# Patient Record
Sex: Female | Born: 1965 | Race: White | Hispanic: No | Smoking: Never smoker
Health system: Southern US, Community
[De-identification: ages and names within clinical notes are randomized; demographics above are authoritative.]

## PROBLEM LIST (undated history)

## (undated) DIAGNOSIS — K8020 Calculus of gallbladder without cholecystitis without obstruction: Secondary | ICD-10-CM

## (undated) DIAGNOSIS — Z8489 Family history of other specified conditions: Secondary | ICD-10-CM

## (undated) DIAGNOSIS — N809 Endometriosis, unspecified: Secondary | ICD-10-CM

## (undated) HISTORY — PX: APPENDECTOMY: SHX54

## (undated) HISTORY — PX: PARTIAL HYSTERECTOMY: SHX80

## (undated) HISTORY — DX: Endometriosis, unspecified: N80.9

## (undated) HISTORY — PX: UMBILICAL HERNIA REPAIR: SHX196

## (undated) HISTORY — PX: RIGHT OOPHORECTOMY: SHX2359

---

## 1998-01-16 ENCOUNTER — Other Ambulatory Visit: Admission: RE | Admit: 1998-01-16 | Discharge: 1998-01-16 | Payer: Self-pay | Admitting: Obstetrics and Gynecology

## 1999-03-13 ENCOUNTER — Other Ambulatory Visit: Admission: RE | Admit: 1999-03-13 | Discharge: 1999-03-13 | Payer: Self-pay | Admitting: Obstetrics and Gynecology

## 2000-03-28 ENCOUNTER — Other Ambulatory Visit: Admission: RE | Admit: 2000-03-28 | Discharge: 2000-03-28 | Payer: Self-pay | Admitting: Obstetrics and Gynecology

## 2000-09-24 ENCOUNTER — Ambulatory Visit (HOSPITAL_COMMUNITY): Admission: RE | Admit: 2000-09-24 | Discharge: 2000-09-24 | Payer: Self-pay | Admitting: Obstetrics and Gynecology

## 2000-09-24 ENCOUNTER — Encounter: Payer: Self-pay | Admitting: Obstetrics and Gynecology

## 2001-01-22 ENCOUNTER — Inpatient Hospital Stay (HOSPITAL_COMMUNITY): Admission: AD | Admit: 2001-01-22 | Discharge: 2001-01-22 | Payer: Self-pay | Admitting: Obstetrics and Gynecology

## 2001-03-18 ENCOUNTER — Inpatient Hospital Stay (HOSPITAL_COMMUNITY): Admission: AD | Admit: 2001-03-18 | Discharge: 2001-03-18 | Payer: Self-pay | Admitting: Obstetrics and Gynecology

## 2001-05-08 ENCOUNTER — Other Ambulatory Visit: Admission: RE | Admit: 2001-05-08 | Discharge: 2001-05-08 | Payer: Self-pay | Admitting: Obstetrics and Gynecology

## 2001-06-10 ENCOUNTER — Inpatient Hospital Stay (HOSPITAL_COMMUNITY): Admission: AD | Admit: 2001-06-10 | Discharge: 2001-06-10 | Payer: Self-pay | Admitting: Obstetrics and Gynecology

## 2001-07-07 ENCOUNTER — Inpatient Hospital Stay (HOSPITAL_COMMUNITY): Admission: AD | Admit: 2001-07-07 | Discharge: 2001-07-07 | Payer: Self-pay | Admitting: Obstetrics & Gynecology

## 2001-08-20 ENCOUNTER — Ambulatory Visit (HOSPITAL_COMMUNITY): Admission: RE | Admit: 2001-08-20 | Discharge: 2001-08-20 | Payer: Self-pay | Admitting: Obstetrics and Gynecology

## 2002-01-12 ENCOUNTER — Inpatient Hospital Stay (HOSPITAL_COMMUNITY): Admission: RE | Admit: 2002-01-12 | Discharge: 2002-01-14 | Payer: Self-pay | Admitting: Obstetrics and Gynecology

## 2002-04-09 ENCOUNTER — Ambulatory Visit (HOSPITAL_COMMUNITY): Admission: RE | Admit: 2002-04-09 | Discharge: 2002-04-09 | Payer: Self-pay | Admitting: Obstetrics and Gynecology

## 2002-04-09 ENCOUNTER — Encounter: Payer: Self-pay | Admitting: Obstetrics and Gynecology

## 2002-05-24 ENCOUNTER — Other Ambulatory Visit: Admission: RE | Admit: 2002-05-24 | Discharge: 2002-05-24 | Payer: Self-pay | Admitting: Obstetrics and Gynecology

## 2002-08-03 ENCOUNTER — Inpatient Hospital Stay (HOSPITAL_COMMUNITY): Admission: AD | Admit: 2002-08-03 | Discharge: 2002-08-03 | Payer: Self-pay | Admitting: Obstetrics and Gynecology

## 2002-08-27 ENCOUNTER — Inpatient Hospital Stay (HOSPITAL_COMMUNITY): Admission: AD | Admit: 2002-08-27 | Discharge: 2002-08-27 | Payer: Self-pay | Admitting: Obstetrics and Gynecology

## 2002-08-28 ENCOUNTER — Inpatient Hospital Stay (HOSPITAL_COMMUNITY): Admission: RE | Admit: 2002-08-28 | Discharge: 2002-08-28 | Payer: Self-pay | Admitting: Obstetrics and Gynecology

## 2002-09-24 ENCOUNTER — Inpatient Hospital Stay (HOSPITAL_COMMUNITY): Admission: AD | Admit: 2002-09-24 | Discharge: 2002-09-24 | Payer: Self-pay | Admitting: Obstetrics & Gynecology

## 2002-09-25 ENCOUNTER — Inpatient Hospital Stay (HOSPITAL_COMMUNITY): Admission: RE | Admit: 2002-09-25 | Discharge: 2002-09-25 | Payer: Self-pay | Admitting: Obstetrics & Gynecology

## 2002-10-21 ENCOUNTER — Encounter: Payer: Self-pay | Admitting: Obstetrics and Gynecology

## 2002-10-21 ENCOUNTER — Inpatient Hospital Stay (HOSPITAL_COMMUNITY): Admission: RE | Admit: 2002-10-21 | Discharge: 2002-10-21 | Payer: Self-pay | Admitting: Obstetrics and Gynecology

## 2003-05-27 ENCOUNTER — Other Ambulatory Visit: Admission: RE | Admit: 2003-05-27 | Discharge: 2003-05-27 | Payer: Self-pay | Admitting: Obstetrics and Gynecology

## 2004-06-18 ENCOUNTER — Other Ambulatory Visit: Admission: RE | Admit: 2004-06-18 | Discharge: 2004-06-18 | Payer: Self-pay | Admitting: Obstetrics and Gynecology

## 2005-08-22 ENCOUNTER — Other Ambulatory Visit: Admission: RE | Admit: 2005-08-22 | Discharge: 2005-08-22 | Payer: Self-pay | Admitting: Obstetrics and Gynecology

## 2018-04-30 ENCOUNTER — Inpatient Hospital Stay (HOSPITAL_COMMUNITY)
Admission: EM | Admit: 2018-04-30 | Discharge: 2018-05-03 | DRG: 419 | Disposition: A | Discharge status: Home / Self Care | Payer: BC Managed Care – PPO | Source: Other Acute Inpatient Hospital | Attending: Internal Medicine | Admitting: Internal Medicine

## 2018-04-30 ENCOUNTER — Encounter (HOSPITAL_COMMUNITY): Payer: Self-pay | Admitting: Internal Medicine

## 2018-04-30 ENCOUNTER — Other Ambulatory Visit: Payer: Self-pay

## 2018-04-30 ENCOUNTER — Inpatient Hospital Stay (HOSPITAL_COMMUNITY): Payer: BC Managed Care – PPO

## 2018-04-30 DIAGNOSIS — K8050 Calculus of bile duct without cholangitis or cholecystitis without obstruction: Secondary | ICD-10-CM | POA: Diagnosis not present

## 2018-04-30 DIAGNOSIS — R945 Abnormal results of liver function studies: Secondary | ICD-10-CM

## 2018-04-30 DIAGNOSIS — R7989 Other specified abnormal findings of blood chemistry: Secondary | ICD-10-CM

## 2018-04-30 DIAGNOSIS — K8051 Calculus of bile duct without cholangitis or cholecystitis with obstruction: Secondary | ICD-10-CM | POA: Diagnosis not present

## 2018-04-30 DIAGNOSIS — M549 Dorsalgia, unspecified: Secondary | ICD-10-CM | POA: Diagnosis present

## 2018-04-30 DIAGNOSIS — R030 Elevated blood-pressure reading, without diagnosis of hypertension: Secondary | ICD-10-CM | POA: Diagnosis present

## 2018-04-30 DIAGNOSIS — K8020 Calculus of gallbladder without cholecystitis without obstruction: Secondary | ICD-10-CM

## 2018-04-30 DIAGNOSIS — Z6838 Body mass index (BMI) 38.0-38.9, adult: Secondary | ICD-10-CM | POA: Diagnosis not present

## 2018-04-30 DIAGNOSIS — K8062 Calculus of gallbladder and bile duct with acute cholecystitis without obstruction: Principal | ICD-10-CM | POA: Diagnosis present

## 2018-04-30 DIAGNOSIS — E669 Obesity, unspecified: Secondary | ICD-10-CM | POA: Diagnosis present

## 2018-04-30 DIAGNOSIS — K8042 Calculus of bile duct with acute cholecystitis without obstruction: Secondary | ICD-10-CM | POA: Diagnosis not present

## 2018-04-30 HISTORY — DX: Calculus of gallbladder without cholecystitis without obstruction: K80.20

## 2018-04-30 HISTORY — DX: Family history of other specified conditions: Z84.89

## 2018-04-30 LAB — COMPREHENSIVE METABOLIC PANEL
ALT: 132 U/L — ABNORMAL HIGH (ref 0–44)
AST: 125 U/L — ABNORMAL HIGH (ref 15–41)
Albumin: 3.3 g/dL — ABNORMAL LOW (ref 3.5–5.0)
Alkaline Phosphatase: 145 U/L — ABNORMAL HIGH (ref 38–126)
Anion gap: 9 (ref 5–15)
BUN: 13 mg/dL (ref 6–20)
CO2: 28 mmol/L (ref 22–32)
Calcium: 9.2 mg/dL (ref 8.9–10.3)
Chloride: 105 mmol/L (ref 98–111)
Creatinine, Ser: 0.88 mg/dL (ref 0.44–1.00)
GFR calc Af Amer: >60 mL/min (ref >60)
GFR calc non Af Amer: >60 mL/min (ref >60)
Glucose, Bld: 116 mg/dL — ABNORMAL HIGH (ref 70–99)
Potassium: 3.6 mmol/L (ref 3.5–5.1)
Sodium: 142 mmol/L (ref 135–145)
Total Bilirubin: 3.6 mg/dL — ABNORMAL HIGH (ref 0.3–1.2)
Total Protein: 6.9 g/dL (ref 6.5–8.1)

## 2018-04-30 LAB — CBC WITH DIFFERENTIAL/PLATELET
Abs Immature Granulocytes: 0 10*3/uL (ref 0.0–0.1)
Basophils Absolute: 0 10*3/uL (ref 0.0–0.1)
Basophils Relative: 0 %
Eosinophils Absolute: 0.1 10*3/uL (ref 0.0–0.7)
Eosinophils Relative: 1 %
HCT: 45.2 % (ref 36.0–46.0)
Hemoglobin: 14.8 g/dL (ref 12.0–15.0)
Immature Granulocytes: 0 %
Lymphocytes Relative: 6 %
Lymphs Abs: 0.6 10*3/uL — ABNORMAL LOW (ref 0.7–4.0)
MCH: 29.7 pg (ref 26.0–34.0)
MCHC: 32.7 g/dL (ref 30.0–36.0)
MCV: 90.6 fL (ref 78.0–100.0)
Monocytes Absolute: 0.6 10*3/uL (ref 0.1–1.0)
Monocytes Relative: 7 %
Neutro Abs: 7.5 10*3/uL (ref 1.7–7.7)
Neutrophils Relative %: 86 %
Platelets: 185 10*3/uL (ref 150–400)
RBC: 4.99 MIL/uL (ref 3.87–5.11)
RDW: 13.1 % (ref 11.5–15.5)
WBC: 8.8 10*3/uL (ref 4.0–10.5)

## 2018-04-30 LAB — PROTIME-INR
INR: 1.1
Prothrombin Time: 14.1 seconds (ref 11.4–15.2)

## 2018-04-30 MED ORDER — MORPHINE SULFATE (PF) 2 MG/ML IV SOLN
2.0000 mg | INTRAVENOUS | Status: DC | PRN
  Administered 2018-04-30 – 2018-05-01 (×5): 2 mg via INTRAVENOUS
  Filled 2018-04-30 (×5): qty 1

## 2018-04-30 MED ORDER — ONDANSETRON HCL 4 MG/2ML IJ SOLN
4.0000 mg | Freq: Four times a day (QID) | INTRAMUSCULAR | Status: DC | PRN
Start: 2018-04-30 — End: 2018-05-03
  Administered 2018-04-30 – 2018-05-01 (×2): 4 mg via INTRAVENOUS
  Filled 2018-04-30 (×2): qty 2

## 2018-04-30 MED ORDER — GADOBENATE DIMEGLUMINE 529 MG/ML IV SOLN
18.0000 mL | Freq: Once | INTRAVENOUS | Status: AC
  Administered 2018-04-30: 18 mL via INTRAVENOUS

## 2018-04-30 MED ORDER — ACETAMINOPHEN 325 MG PO TABS
650.0000 mg | ORAL_TABLET | Freq: Four times a day (QID) | ORAL | Status: DC | PRN
  Administered 2018-05-03: 650 mg via ORAL
  Filled 2018-04-30: qty 2

## 2018-04-30 MED ORDER — PIPERACILLIN-TAZOBACTAM 3.375 G IVPB
3.3750 g | Freq: Three times a day (TID) | INTRAVENOUS | Status: DC
  Administered 2018-04-30: 3.375 g via INTRAVENOUS
  Filled 2018-04-30 (×2): qty 50

## 2018-04-30 MED ORDER — LACTATED RINGERS IV SOLN
INTRAVENOUS | Status: DC
  Administered 2018-04-30 – 2018-05-02 (×2): via INTRAVENOUS

## 2018-04-30 MED ORDER — ONDANSETRON HCL 4 MG PO TABS
4.0000 mg | ORAL_TABLET | Freq: Four times a day (QID) | ORAL | Status: DC | PRN
  Administered 2018-05-02: 4 mg via ORAL
  Filled 2018-04-30: qty 1

## 2018-04-30 MED ORDER — PIPERACILLIN-TAZOBACTAM 3.375 G IVPB
3.3750 g | Freq: Three times a day (TID) | INTRAVENOUS | Status: DC
  Administered 2018-04-30 – 2018-05-03 (×7): 3.375 g via INTRAVENOUS
  Filled 2018-04-30 (×9): qty 50

## 2018-04-30 MED ORDER — ACETAMINOPHEN 650 MG RE SUPP: 650.0000 mg | Freq: Four times a day (QID) | RECTAL | Status: DC | PRN

## 2018-04-30 MED ORDER — LORAZEPAM 2 MG/ML IJ SOLN
1.0000 mg | Freq: Once | INTRAMUSCULAR | Status: DC
  Filled 2018-04-30: qty 1

## 2018-04-30 NOTE — Consult Note (Signed)
[facility] Surgery Consult Note  Cindy Hall September 01, 1966  [id number].    Requesting MD: Karmen Bongo Chief Complaint/Reason for Consult: gallstones  HPI:  Cindy Hall is a 52yo female transferred from [facility] to [facility] today with acute cholecystitis/possible choledocholithiasis. Patient states that she had acute onset chest pain and nausea on Monday. Evaluated in the ED with negative cardiac workup and she was sent home with phenergan. Initially felt somewhat better then the pain got worse again yesterday despite ibuprofen so she called 911. Pain is in her chest, upper abdomen, and radiating into her mid-back. Constant and severe. Worse with PO intake. Associated with chills and nausea. Prior to these 2 episodes she had never had pain like this before.  ED workup included a CT abdomen that reports cholelithiasis and choledocholithiasis with gallbladder changes consistent with acute cholecystitis, mild bile duct dilatation. WBC 13.6, bilirubin 1.8, AST 76.1, ALT 58, alk phos 143. Patient was transferred to [facility] for evaluation by GI due to possible choledocholithiasis. She was started on zosyn.  No significant PMH Abdominal surgical history: open appendectomy, partial hysterectomy, umbilical hernia repair Anticoagulants: none Nonsmoker Employment: Community education officer  ROS: Review of Systems  Constitutional: Positive for chills.  HENT: Negative.   Eyes: Negative.   Respiratory: Negative.   Cardiovascular: Positive for chest pain.  Gastrointestinal: Positive for abdominal pain and nausea.  Genitourinary: Negative.   Musculoskeletal: Positive for back pain.  Skin: Negative.   Neurological: Negative.    All systems reviewed and otherwise negative except for as above  Family History  Problem Relation Age of Onset  . Colon cancer Mother 21  . Gallbladder disease Neg Hx     Past Medical History:  Diagnosis Date  . Family history of adverse reaction to anesthesia     " my father had to be put on a ventilator after surgery "  . Gallstones 04/30/2018    Past Surgical History:  Procedure Laterality Date  . APPENDECTOMY     ruptured appy  . PARTIAL HYSTERECTOMY    . UMBILICAL HERNIA REPAIR      Social History:  reports that she has never smoked. She has never used smokeless tobacco. She reports that she does not drink alcohol or use drugs.  Allergies: No Known Allergies  No medications prior to admission.    Prior to Admission medications   Not on File    Blood pressure (!) 144/81, pulse 72, temperature 98.6 F (37 C), temperature source Oral, resp. rate 18. Physical Exam: General: pleasant, WD/WN white female who is laying in bed in NAD HEENT: head is normocephalic, atraumatic.  Sclera are noninjected.  Pupils equal and round.  Ears and nose without any masses or lesions.  Mouth is pink and moist. Dentition fair Heart: regular, rate, and rhythm.  No obvious murmurs, gallops, or rubs noted.  Palpable pedal pulses bilaterally Lungs: CTAB, no wheezes, rhonchi, or rales noted.  Respiratory effort nonlabored Abd: well healed RLQ and periumbilical incisions, soft, ND, +BS, TTP epigastric region with voluntary guarding MS: all 4 extremities are symmetrical with no cyanosis, clubbing, or edema. Skin: warm and dry with no masses, lesions, or rashes Psych: A&Ox3 with an appropriate affect. Neuro: cranial nerves grossly intact, extremity CSM intact bilaterally, normal speech  Results for orders placed or performed during the hospital encounter of 04/30/18 (from the past 48 hour(s))  CBC with Differential/Platelet     Status: Abnormal   Collection Time: 04/30/18 10:54 AM  Result Value Ref Range  WBC 8.8 4.0 - 10.5 K/uL   RBC 4.99 3.87 - 5.11 MIL/uL   Hemoglobin 14.8 12.0 - 15.0 g/dL   HCT 45.2 36.0 - 46.0 %   MCV 90.6 78.0 - 100.0 fL   MCH 29.7 26.0 - 34.0 pg   MCHC 32.7 30.0 - 36.0 g/dL   RDW 13.1 11.5 - 15.5 %   Platelets 185 150 - 400 K/uL    Neutrophils Relative % 86 %   Neutro Abs 7.5 1.7 - 7.7 K/uL   Lymphocytes Relative 6 %   Lymphs Abs 0.6 (L) 0.7 - 4.0 K/uL   Monocytes Relative 7 %   Monocytes Absolute 0.6 0.1 - 1.0 K/uL   Eosinophils Relative 1 %   Eosinophils Absolute 0.1 0.0 - 0.7 K/uL   Basophils Relative 0 %   Basophils Absolute 0.0 0.0 - 0.1 K/uL   Immature Granulocytes 0 %   Abs Immature Granulocytes 0.0 0.0 - 0.1 K/uL    Comment: Performed at [facility] [address], [place], [place] [address]  Comprehensive metabolic panel     Status: Abnormal   Collection Time: 04/30/18 10:54 AM  Result Value Ref Range   Sodium 142 135 - 145 mmol/L   Potassium 3.6 3.5 - 5.1 mmol/L   Chloride 105 98 - 111 mmol/L   CO2 28 22 - 32 mmol/L   Glucose, Bld 116 (H) 70 - 99 mg/dL   BUN 13 6 - 20 mg/dL   Creatinine, Ser 0.88 0.44 - 1.00 mg/dL   Calcium 9.2 8.9 - 10.3 mg/dL   Total Protein 6.9 6.5 - 8.1 g/dL   Albumin 3.3 (L) 3.5 - 5.0 g/dL   AST 125 (H) 15 - 41 U/L   ALT 132 (H) 0 - 44 U/L   Alkaline Phosphatase 145 (H) 38 - 126 U/L   Total Bilirubin 3.6 (H) 0.3 - 1.2 mg/dL   GFR calc non Af Amer >60 >60 mL/min   GFR calc Af Amer >60 >60 mL/min    Comment: (NOTE) The eGFR has been calculated using the CKD EPI equation. This calculation has not been validated in all clinical situations. eGFR's persistently <60 mL/min signify possible Chronic Kidney Disease.    Anion gap 9 5 - 15    Comment: Performed at [facility] [address] [place], [place] [address]  Protime-INR     Status: None   Collection Time: 04/30/18 10:54 AM  Result Value Ref Range   Prothrombin Time 14.1 11.4 - 15.2 seconds   INR 1.10     Comment: Performed at [facility] [address], [place], [place] [address]   No results found.  Anti-infectives (From admission, onward)   Start     Dose/Rate Route Frequency Ordered Stop   04/30/18 0945  piperacillin-tazobactam (ZOSYN) IVPB 3.375 g     3.375 g 12.5 mL/hr over 240  Minutes Intravenous Every 8 hours 04/30/18 0935         Assessment/Plan Acute cholecystitis Elevated bilirubin ?Choledocholithasis - CT abdomen reports cholelithiasis and choledocholithiasis with gallbladder changes consistent with acute cholecystitis, mild bile duct dilatation - WBC 13.6, bilirubin 1.8, AST 76.1, ALT 58, alk phos 143  ID - zosyn 8/15>> VTE - SCDs FEN - IVF, NPO Foley - none  Plan - Patient with acute cholecystitis and possible choledocholithiasis. GI evaluation pending, may need MRCP or ERCP prior to surgery. Will plan for laparoscopic cholecystectomy this admission. Continue IV zosyn. General surgery will continue  to follow.  Wellington Hampshire, [facility] Surgery 04/30/2018, 12:17 PM Pager: [phone] Consults: [phone] Mon 7:00 am -11:30 AM Tues-Fri 7:00 am-4:30 pm Sat-Sun 7:00 am-11:30 am

## 2018-04-30 NOTE — H&P (Signed)
History and Physical    Cindy Perchesenny B Sox ZOX:[id number] DOB: 12-25-1965 DOA: 04/30/2018  PCP:   Boneta LucksMoore, [facility] Consultants:  None Patient coming from:  Home - lives with 2 sons; NOKRockey Situ: Brother, [phone]   Chief Complaint:  Back pain  HPI: Cindy Hall is a 52 y.o. female with no significant past medical history [facility]ole[facility]/choledocholithiasis.   Monday, she felt sick to her stomach like she ate something bad. She had some back pain but thought it was related to her chair at work.  The back pain worsened overnight and is constant in the middle of her back.  She called 911 and [facility]; she had a cardiac evaluation with negative troponin, negative EKG, and negative CTA chest and was discharged.  She took 1 Phenergan but her nausea subsided and she didn't need anymore.  She took 3 doses of Ibuprofen for pain.  Tuesday, she just laid around.  Wednesday, she felt a little better but then she got chills in the middle of the day.  Last night, about 930pm, she noticed chest pain.  She broke out into a sweat and again called 911.   [facility]  The doctor came in and told her that her liver and WBC didn't look good so he "did the gallbladder thing and he came in with the bad news."  She is currently having ongoing severe back pain - she does not normally have back pain.  Mild nausea.  No vomiting.   ED Course:  CT A/P concerning for acute cholecystitis and choledocholithiasis with mild CBD dilatation.  Started on IVF and Zosyn.  Review of Systems: As per HPI; otherwise review of systems reviewed and negative.   Ambulatory Status:  Ambulates without assistance  History reviewed. No pertinent past medical history.  Past Surgical History:  Procedure Laterality Date  . APPENDECTOMY     ruptured appy  . PARTIAL HYSTERECTOMY    . UMBILICAL HERNIA REPAIR      Social History   Socioeconomic History  .  Marital status: Married    Spouse name: Not on file  . Number of children: Not on file  . Years of education: Not on file  . Highest education level: Not on file  Occupational History  . Occupation: Geologist, engineeringteacher assistant, [employer]  Social Needs  . Financial resource strain: Not on file  . Food insecurity:    Worry: Not on file    Inability: Not on file  . Transportation needs:    Medical: Not on file    Non-medical: Not on file  Tobacco Use  . Smoking status: Never Smoker  . Smokeless tobacco: Never Used  Substance and Sexual Activity  . Alcohol use: Never    Frequency: Never  . Drug use: Never  . Sexual activity: Not on file  Lifestyle  . Physical activity:    Days per week: Not on file    Minutes per session: Not on file  . Stress: Not on file  Relationships  . Social connections:    Talks on phone: Not on file    Gets together: Not on file    Attends religious service: Not on file    Active member of club or organization: Not on file    Attends meetings of clubs or organizations: Not on file    Relationship status: Not on file  . Intimate partner violence:  Fear of current or ex partner: Not on file    Emotionally abused: Not on file    Physically abused: Not on file    Forced sexual activity: Not on file  Other Topics Concern  . Not on file  Social History Narrative  . Not on file    No Known Allergies  Family History  Problem Relation Age of Onset  . Colon cancer Mother 6379  . Gallbladder disease Neg Hx     Prior to Admission medications   Not on File    Physical Exam: Vitals:   04/30/18 0830  BP: (!) 144/81  Pulse: 72  Resp: 18  Temp: 98.6 F (37 C)  TempSrc: Oral     General:  Appears calm and comfortable and is NAD Eyes:   EOMI, normal lids, iris ENT:  grossly normal hearing, lips & tongue, mmm Neck:  no LAD, masses or thyromegaly Cardiovascular:  RRR, no m/r/g. No LE edema.  Respiratory:   CTA bilaterally with no  wheezes/rales/rhonchi.  Normal respiratory effort. Abdomen:  soft, quite tender to palpation in LUQ and midepigastric region but otherwise NT, ND, NABS Back:   normal alignment, no CVAT Skin:  no rash or induration seen on limited exam Musculoskeletal:  grossly normal tone BUE/BLE, good ROM, no bony abnormality Lower extremity:  No LE edema.  Limited foot exam with no ulcerations.  2+ distal pulses. Psychiatric:  grossly normal mood and affect, speech fluent and appropriate, AOx3 Neurologic:  CN 2-12 grossly intact, moves all extremities in coordinated fashion, sensation intact    Radiological Exams on Admission: No results found.   Labs on Admission: I have personally reviewed the available labs and imaging studies at the time of the admission.  Pertinent labs:   Per [facility] - all labs normal the day prior:  WBC 13.6 AST 76 ALT 58 Bili 1.8  Assessment/Plan Active Problems:   Choledocholithiasis with acute cholecystitis   -[facility] RUQ/midepigastric/back pain -[facility]ole[facility] with probable choledocholithiasis -Will keep patient NPO -Morphine for pain, Zofran for nausea -GI has been consulted, as she is likely to need ERCP -Will also consult surgery, as she is likely to need cholecystectomy -Empiric coverage with Zosyn for now -Will check CBC/CMP now and in AM   DVT prophylaxis:  SCDs pending procedures Code Status:  Full - confirmed with patient Family Communication: None present Disposition Plan:  Home once clinically improved Consults called: GI/Surgery  Admission status: Admit - It is my clinical opinion that admission to INPATIENT is reasonable and necessary because of the expectation that this patient will require hospital care that crosses at least 2 midnights to treat this condition based on the medical complexity of the problems presented.  Given the aforementioned information, the predictability of an  adverse outcome is felt to be significant.    Jonah BlueJennifer Daaiyah Baumert MD [facility]  If note is complete, please contact covering daytime or nighttime physician. [email or web address] Password [email or web address]  04/30/2018, 9:34 AM

## 2018-04-30 NOTE — Plan of Care (Signed)
Patient stable, discussed POC with patient, agreeable with plan, denies question/concerns at this time.  

## 2018-04-30 NOTE — Consult Note (Signed)
Subjective:   HPI  The patient is a 52 year old [facility] with cholecystitis and possible choledocholithiasis. She went to the outside emergency room with complaints of chest pain and back pain. CT of the abdomen was done showing thickened gallbladder wall gallstones and a stone in the CBD. LFTs were elevated. Patient is comfortable at this time. General surgery has seen the patient. We are asked to see her in regards to the need for ERCP versus MRCP.     Past Medical History:  Diagnosis Date  . Family history of adverse reaction to anesthesia    " my father had to be put on a ventilator after surgery "  . Gallstones 04/30/2018   Past Surgical History:  Procedure Laterality Date  . APPENDECTOMY     ruptured appy  . PARTIAL HYSTERECTOMY    . UMBILICAL HERNIA REPAIR     Social History   Socioeconomic History  . Marital status: Married    Spouse name: Not on file  . Number of children: Not on file  . Years of education: Not on file  . Highest education level: Not on file  Occupational History  . Occupation: Geologist, engineeringteacher assistant, [employer]  Social Needs  . Financial resource strain: Not on file  . Food insecurity:    Worry: Not on file    Inability: Not on file  . Transportation needs:    Medical: Not on file    Non-medical: Not on file  Tobacco Use  . Smoking status: Never Smoker  . Smokeless tobacco: Never Used  Substance and Sexual Activity  . Alcohol use: Never    Frequency: Never  . Drug use: Never  . Sexual activity: Not on file  Lifestyle  . Physical activity:    Days per week: Not on file    Minutes per session: Not on file  . Stress: Not on file  Relationships  . Social connections:    Talks on phone: Not on file    Gets together: Not on file    Attends religious service: Not on file    Active member of club or organization: Not on file    Attends meetings of clubs or organizations: Not  on file    Relationship status: Not on file  . Intimate partner violence:    Fear of current or ex partner: Not on file    Emotionally abused: Not on file    Physically abused: Not on file    Forced sexual activity: Not on file  Other Topics Concern  . Not on file  Social History Narrative  . Not on file   family history includes Colon cancer (age of onset: 7379) in her mother.  Current Facility-Administered Medications:  .  acetaminophen (TYLENOL) tablet 650 mg, 650 mg, Oral, Q6H PRN **OR** acetaminophen (TYLENOL) suppository 650 mg, 650 mg, Rectal, Q6H PRN, Jonah BlueYates, Jennifer, MD .  lactated ringers infusion, , Intravenous, Continuous, Jonah BlueYates, Jennifer, MD, Last Rate: 125 mL/hr at 04/30/18 1254 .  morphine 2 MG/ML injection 2 mg, 2 mg, Intravenous, Q2H PRN, Jonah BlueYates, Jennifer, MD, 2 mg at 04/30/18 1015 .  ondansetron (ZOFRAN) tablet 4 mg, 4 mg, Oral, Q6H PRN **OR** ondansetron (ZOFRAN) injection 4 mg, 4 mg, Intravenous, Q6H PRN, Jonah BlueYates, Jennifer, MD, 4 mg at 04/30/18 1016 .  piperacillin-tazobactam (ZOSYN) IVPB 3.375 g, 3.375 g, Intravenous, Q8H, Pierce, Dwayne A, RPH, Last Rate: 12.5 mL/hr at 04/30/18 1255, 3.375 g at 04/30/18 1255 No Known  Allergies   Objective:     BP 129/76 (BP Location: Right Arm)   Pulse 73   Temp 98.3 F (36.8 C) (Oral)   Resp 18   Alert and oriented  No distress  Heart regular rhythm no murmurs  Lungs clear  Abdomen soft and nontender at this time  Laboratory No components found for: D1    Assessment:     Cholecystitis  Rule out choledocholithiasis      Plan:     MRCP to further investigate and see if there is in fact a CBD stone. Follow LFTs. If there is a stone in CBD we will then plan ERCP. If there is not a stone in the CBD then she will have a laparoscopic cholecystectomy.We will follow.

## 2018-04-30 NOTE — Progress Notes (Signed)
ANTIBIOTIC CONSULT NOTE - INITIAL  Pharmacy Consult for Zosyn Indication: Intraabdominal infection  No Known Allergies  Patient Measurements:   Adjusted Body Weight: unknown  Vital Signs: Temp: 98.6 F (37 C) (08/15 0830) Temp Source: Oral (08/15 0830) BP: 144/81 (08/15 0830) Pulse Rate: 72 (08/15 0830) Intake/Output from previous day: No intake/output data recorded. Intake/Output from this shift: No intake/output data recorded.  Labs: No results for input(s): WBC, HGB, PLT, LABCREA, CREATININE in the last 72 hours. CrCl cannot be calculated (No successful lab value found.). No results for input(s): VANCOTROUGH, VANCOPEAK, VANCORANDOM, GENTTROUGH, GENTPEAK, GENTRANDOM, TOBRATROUGH, TOBRAPEAK, TOBRARND, AMIKACINPEAK, AMIKACINTROU, AMIKACIN in the last 72 hours.   Microbiology: No results found for this or any previous visit (from the past 720 hour(s)).  Medical History: History reviewed. No pertinent past medical history.   Assessment: 52 yo female who presents for acute cholecystitis/choledocholithiasis. Pharmacy consulted for zosyn dosing. NKA noted. Unable to assess renal function at this time.    Plan:  Zosyn 3.375gm IV q8h Monitor for renal function, clinical course, LOT  Latiesha Harada A. Jeanella CrazePierce, PharmD, BCPS Clinical Pharmacist [place] Pager: [phone] Please utilize Amion for appropriate phone number to reach the unit pharmacist [facility]    04/30/2018,9:31 AM

## 2018-04-30 NOTE — Progress Notes (Signed)
Patient arrived to unit, VSS, NAD noted, MD paged to bedside.

## 2018-05-01 ENCOUNTER — Inpatient Hospital Stay (HOSPITAL_COMMUNITY): Payer: BC Managed Care – PPO | Admitting: Certified Registered Nurse Anesthetist

## 2018-05-01 ENCOUNTER — Encounter (HOSPITAL_COMMUNITY): Payer: Self-pay

## 2018-05-01 ENCOUNTER — Encounter (HOSPITAL_COMMUNITY)
Admission: EM | Disposition: A | Discharge status: Home / Self Care | Payer: Self-pay | Source: Other Acute Inpatient Hospital | Attending: Internal Medicine

## 2018-05-01 ENCOUNTER — Inpatient Hospital Stay (HOSPITAL_COMMUNITY): Payer: BC Managed Care – PPO

## 2018-05-01 DIAGNOSIS — R7989 Other specified abnormal findings of blood chemistry: Secondary | ICD-10-CM

## 2018-05-01 DIAGNOSIS — K8050 Calculus of bile duct without cholangitis or cholecystitis without obstruction: Secondary | ICD-10-CM

## 2018-05-01 DIAGNOSIS — R945 Abnormal results of liver function studies: Secondary | ICD-10-CM

## 2018-05-01 DIAGNOSIS — K8051 Calculus of bile duct without cholangitis or cholecystitis with obstruction: Secondary | ICD-10-CM

## 2018-05-01 HISTORY — PX: REMOVAL OF STONES: SHX5545

## 2018-05-01 HISTORY — PX: SPHINCTEROTOMY: SHX5544

## 2018-05-01 HISTORY — PX: ENDOSCOPIC RETROGRADE CHOLANGIOPANCREATOGRAPHY (ERCP) WITH PROPOFOL: SHX5810

## 2018-05-01 LAB — COMPREHENSIVE METABOLIC PANEL
ALT: 115 U/L — ABNORMAL HIGH (ref 0–44)
AST: 67 U/L — ABNORMAL HIGH (ref 15–41)
Albumin: 2.8 g/dL — ABNORMAL LOW (ref 3.5–5.0)
Alkaline Phosphatase: 150 U/L — ABNORMAL HIGH (ref 38–126)
Anion gap: 8 (ref 5–15)
BUN: 15 mg/dL (ref 6–20)
CO2: 28 mmol/L (ref 22–32)
Calcium: 8.9 mg/dL (ref 8.9–10.3)
Chloride: 107 mmol/L (ref 98–111)
Creatinine, Ser: 0.88 mg/dL (ref 0.44–1.00)
GFR calc Af Amer: >60 mL/min (ref >60)
GFR calc non Af Amer: >60 mL/min (ref >60)
Glucose, Bld: 90 mg/dL (ref 70–99)
Potassium: 3.5 mmol/L (ref 3.5–5.1)
Sodium: 143 mmol/L (ref 135–145)
Total Bilirubin: 2.1 mg/dL — ABNORMAL HIGH (ref 0.3–1.2)
Total Protein: 6.1 g/dL — ABNORMAL LOW (ref 6.5–8.1)

## 2018-05-01 LAB — CBC
HCT: 41.2 % (ref 36.0–46.0)
Hemoglobin: 13.6 g/dL (ref 12.0–15.0)
MCH: 30 pg (ref 26.0–34.0)
MCHC: 33 g/dL (ref 30.0–36.0)
MCV: 90.9 fL (ref 78.0–100.0)
Platelets: 159 10*3/uL (ref 150–400)
RBC: 4.53 MIL/uL (ref 3.87–5.11)
RDW: 13 % (ref 11.5–15.5)
WBC: 5.2 10*3/uL (ref 4.0–10.5)

## 2018-05-01 LAB — HIV ANTIBODY (ROUTINE TESTING W REFLEX): HIV Screen 4th Generation wRfx: NONREACTIVE

## 2018-05-01 LAB — LIPASE, BLOOD: Lipase: 21 U/L (ref 11–51)

## 2018-05-01 SURGERY — ENDOSCOPIC RETROGRADE CHOLANGIOPANCREATOGRAPHY (ERCP) WITH PROPOFOL
Anesthesia: General

## 2018-05-01 MED ORDER — LIDOCAINE 2% (20 MG/ML) 5 ML SYRINGE
INTRAMUSCULAR | Status: DC | PRN
  Administered 2018-05-01: 100 mg via INTRAVENOUS

## 2018-05-01 MED ORDER — PROPOFOL 10 MG/ML IV BOLUS
INTRAVENOUS | Status: DC | PRN
  Administered 2018-05-01: 170 mg via INTRAVENOUS

## 2018-05-01 MED ORDER — SUGAMMADEX SODIUM 200 MG/2ML IV SOLN
INTRAVENOUS | Status: DC | PRN
  Administered 2018-05-01: 200 mg via INTRAVENOUS

## 2018-05-01 MED ORDER — IOPAMIDOL (ISOVUE-300) INJECTION 61%
INTRAVENOUS | Status: AC
  Filled 2018-05-01: qty 50

## 2018-05-01 MED ORDER — INDOMETHACIN 50 MG RE SUPP
RECTAL | Status: AC
  Filled 2018-05-01: qty 2

## 2018-05-01 MED ORDER — INDOMETHACIN 50 MG RE SUPP
100.0000 mg | Freq: Once | RECTAL | Status: AC
  Administered 2018-05-02: 100 mg via RECTAL
  Filled 2018-05-01: qty 2

## 2018-05-01 MED ORDER — SODIUM CHLORIDE 0.9 % IV SOLN
INTRAVENOUS | Status: DC | PRN
  Administered 2018-05-01: 40 mL

## 2018-05-01 MED ORDER — GLUCAGON HCL RDNA (DIAGNOSTIC) 1 MG IJ SOLR
INTRAMUSCULAR | Status: AC
  Filled 2018-05-01: qty 1

## 2018-05-01 MED ORDER — FENTANYL CITRATE (PF) 100 MCG/2ML IJ SOLN
INTRAMUSCULAR | Status: DC | PRN
  Administered 2018-05-01: 100 ug via INTRAVENOUS

## 2018-05-01 MED ORDER — ONDANSETRON HCL 4 MG/2ML IJ SOLN
INTRAMUSCULAR | Status: DC | PRN
  Administered 2018-05-01: 4 mg via INTRAVENOUS

## 2018-05-01 MED ORDER — LACTATED RINGERS IV SOLN
INTRAVENOUS | Status: DC | PRN
  Administered 2018-05-01 (×2): via INTRAVENOUS

## 2018-05-01 MED ORDER — INDOMETHACIN 50 MG RE SUPP
RECTAL | Status: DC | PRN
  Administered 2018-05-01: 100 mg via RECTAL

## 2018-05-01 MED ORDER — MIDAZOLAM HCL 5 MG/5ML IJ SOLN
INTRAMUSCULAR | Status: DC | PRN
  Administered 2018-05-01: 2 mg via INTRAVENOUS

## 2018-05-01 MED ORDER — GLYCOPYRROLATE 0.2 MG/ML IJ SOLN
INTRAMUSCULAR | Status: DC | PRN
  Administered 2018-05-01: 0.1 mg via INTRAVENOUS

## 2018-05-01 MED ORDER — PHENYLEPHRINE 40 MCG/ML (10ML) SYRINGE FOR IV PUSH (FOR BLOOD PRESSURE SUPPORT)
PREFILLED_SYRINGE | INTRAVENOUS | Status: DC | PRN
  Administered 2018-05-01: 80 ug via INTRAVENOUS

## 2018-05-01 MED ORDER — DIPHENHYDRAMINE HCL 50 MG/ML IJ SOLN
INTRAMUSCULAR | Status: DC | PRN
  Administered 2018-05-01: 25 mg via INTRAVENOUS

## 2018-05-01 MED ORDER — ROCURONIUM BROMIDE 100 MG/10ML IV SOLN
INTRAVENOUS | Status: DC | PRN
  Administered 2018-05-01: 50 mg via INTRAVENOUS

## 2018-05-01 MED ORDER — DEXAMETHASONE SODIUM PHOSPHATE 4 MG/ML IJ SOLN
INTRAMUSCULAR | Status: DC | PRN
  Administered 2018-05-01: 8 mg via INTRAVENOUS

## 2018-05-01 NOTE — Interval H&P Note (Signed)
History and Physical Interval Note:  05/01/2018 2:01 PM  Cindy Hall  has presented today for surgery, with the diagnosis of choledocholithiasis  The various methods of treatment have been discussed with the patient and family. After consideration of risks, benefits and other options for treatment, the patient has consented to  Procedure(s): ENDOSCOPIC RETROGRADE CHOLANGIOPANCREATOGRAPHY (ERCP) WITH PROPOFOL (N/A) as a surgical intervention .  The patient's history has been reviewed, patient examined, no change in status, stable for surgery.  I have reviewed the patient's chart and labs.  Questions were answered to the patient's satisfaction.     [name]

## 2018-05-01 NOTE — Transfer of Care (Signed)
Immediate Anesthesia Transfer of Care Note  Patient: Carylon Perchesenny B Miera  Procedure(s) Performed: ENDOSCOPIC RETROGRADE CHOLANGIOPANCREATOGRAPHY (ERCP) WITH PROPOFOL (N/A ) SPHINCTEROTOMY  Patient Location: Endoscopy Unit  Anesthesia Type:General  Level of Consciousness: drowsy and patient cooperative  Airway & Oxygen Therapy: Patient Spontanous Breathing and Patient connected to face mask oxygen  Post-op Assessment: Report given to RN and Post -op Vital signs reviewed and stable  Post vital signs: Reviewed and stable  Last Vitals:  Vitals Value Taken Time  BP 164/91 05/01/2018  3:19 PM  Temp    Pulse 78 05/01/2018  3:20 PM  Resp 13 05/01/2018  3:20 PM  SpO2 100 % 05/01/2018  3:20 PM  Vitals shown include unvalidated device data.  Last Pain:  Vitals:   05/01/18 1322  TempSrc: Oral  PainSc: 0-No pain         Complications: No apparent anesthesia complications

## 2018-05-01 NOTE — Progress Notes (Signed)
Case discussed with Dr. Russella DarStark who has agreed to do the ERCP for CBD stones. Explained this to the patient. I explained the ERCP. I explained the potential risks of bleeding, infection, perforation, and pancreatitis. She understands and is agreeable to have procedure done. Appreciate Dr. Ardell IsaacsStark's help.

## 2018-05-01 NOTE — Anesthesia Preprocedure Evaluation (Signed)
Anesthesia Evaluation  Patient identified by MRN, date of birth, ID band Patient awake    Reviewed: Allergy & Precautions, NPO status , Patient's Chart, lab work & pertinent test results  History of Anesthesia Complications (+) Family history of anesthesia reaction and history of anesthetic complications  Airway Mallampati: II  TM Distance: >3 FB Neck ROM: Full    Dental no notable dental hx. (+) Dental Advisory Given   Pulmonary neg pulmonary ROS,    Pulmonary exam normal        Cardiovascular negative cardio ROS Normal cardiovascular exam     Neuro/Psych negative neurological ROS  negative psych ROS   GI/Hepatic Neg liver ROS,   Endo/Other  negative endocrine ROSMorbid obesity  Renal/GU negative Renal ROS  negative genitourinary   Musculoskeletal negative musculoskeletal ROS (+)   Abdominal   Peds negative pediatric ROS (+)  Hematology negative hematology ROS (+)   Anesthesia Other Findings   Reproductive/Obstetrics negative OB ROS                             Anesthesia Physical Anesthesia Plan  ASA: II  Anesthesia Plan: General   Post-op Pain Management:    Induction: Intravenous  PONV Risk Score and Plan: 3 and Ondansetron, Treatment may vary due to age or medical condition, Dexamethasone and Diphenhydramine  Airway Management Planned: Oral ETT  Additional Equipment:   Intra-op Plan:   Post-operative Plan: Extubation in OR  Informed Consent: I have reviewed the patients History and Physical, chart, labs and discussed the procedure including the risks, benefits and alternatives for the proposed anesthesia with the patient or authorized representative who has indicated his/her understanding and acceptance.   Dental advisory given  Plan Discussed with: CRNA and Anesthesiologist  Anesthesia Plan Comments:         Anesthesia Quick Evaluation

## 2018-05-01 NOTE — Op Note (Signed)
[facility] Patient Name: Cindy Hall Procedure Date : 05/01/2018 MRN: [id number] Attending MD: Cindy DareMalcolm T Channon Brougher , MD Date of Birth: 1966/04/18 CSN: [id number] Age: 52 Admit Type: Inpatient Procedure:                ERCP Indications:              Bile duct stone(s), Abnormal MRCP, Elevated liver                            enzymes Providers:                [name], MD, Dwain SarnaPatricia Ford, RN, Beryle BeamsJanie                            Billups, Technician Referring MD:             Graylin ShiverSalem F. Ganem, MD Medicines:                General Anesthesia Complications:            No immediate complications. Estimated Blood Loss:     Estimated blood loss: none. Procedure:                Pre-Anesthesia Assessment:                           - Prior to the procedure, a History and Physical                            was performed, and patient medications and                            allergies were reviewed. The patient's tolerance of                            previous anesthesia was also reviewed. The risks                            and benefits of the procedure and the sedation                            options and risks were discussed with the patient.                            All questions were answered, and informed consent                            was obtained. Prior Anticoagulants: The patient has                            taken no previous anticoagulant or antiplatelet                            agents. ASA Grade Assessment: II - A patient with  mild systemic disease. After reviewing the risks                            and benefits, the patient was deemed in                            satisfactory condition to undergo the procedure.                           After obtaining informed consent, the scope was                            passed under direct vision. Throughout the                            procedure, the patient's blood pressure, pulse,  and                            oxygen saturations were monitored continuously. The                            [id number]-Q180V ([id number]) Olympus ERCP was introduced                            through the mouth, and used to inject contrast into                            and used to inject contrast into the bile duct. The                            ERCP was accomplished without difficulty. The                            patient tolerated the procedure well. Scope In: Scope Out: Findings:      The scout film was normal. The esophagus was successfully intubated       under direct vision. The scope was advanced to a normal major papilla in       the descending duodenum without detailed examination of the pharynx,       larynx and associated structures, and upper GI tract. The upper GI tract       was grossly normal. A straight Roadrunner wire was passed into the       biliary tree. The short-nosed traction sphincterotome was passed over       the guidewire and the bile duct was then deeply cannulated. Contrast was       injected. I personally interpreted the bile duct images. There was       appropriate flow of contrast through the ducts. The common bile duct       contained filling defects thought to be a stone and sludge. The common       bile duct was diffusely dilated, with a stone causing an obstruction.       The largest diameter was 9 mm. An 8 mm biliary sphincterotomy was made       with a traction (standard) sphincterotome using  ERBE electrocautery.       There was no post-sphincterotomy bleeding. The biliary tree was swept       several times with a 9-12 mm balloon starting at the bifurcation. Sludge       was swept from the duct. One stone was removed. No stones remained. A       small amount of pus exited from the duct. The final occlusion       cholangiogram did not show any filling defects. The PD was not       cannulated or injected by intention. Impression:               - Filling  defects consistent with a stone and                            sludge noted on the cholangiogram.                           - The common bile duct was mildly dilated, with a                            stone causing an obstruction.                           - Choledocholithiasis, sludge. Complete removal was                            accomplished by biliary sphincterotomy and balloon                            extraction.                           - Cholangitis. Recommendation:           - Indocin 100 mg supp now and then avoid aspirin                            and nonsteroidal anti-inflammatory medicines for 1                            week.                           - Clear liquid diet today.                           - Continue IV antibiotics.                           - Trend LFTs.                           - Cholecystectomy per surgical team.                           - Return to Dr. Evette Cristal for ongoing GI care. We are  available if needed. Procedure Code(s):        --- Professional ---                           [phone], Endoscopic retrograde                            cholangiopancreatography (ERCP); with removal of                            calculi/debris from biliary/pancreatic duct(s)                           43262, Endoscopic retrograde                            cholangiopancreatography (ERCP); with                            sphincterotomy/papillotomy Diagnosis Code(s):        --- Professional ---                           R93.2, Abnormal findings on diagnostic imaging of                            liver and biliary tract                           K80.51, Calculus of bile duct without cholangitis                            or cholecystitis with obstruction                           R74.8, Abnormal levels of other serum enzymes CPT copyright 2017 American Medical Association. All rights reserved. The codes documented in this report are preliminary  and upon coder review may  be revised to meet current compliance requirements. Cindy DareMalcolm T Guerry Covington, MD 05/01/2018 3:12:49 PM This report has been signed electronically. Number of Addenda: 0

## 2018-05-01 NOTE — Anesthesia Postprocedure Evaluation (Signed)
Anesthesia Post Note  Patient: Cindy Hall  Procedure(s) Performed: ENDOSCOPIC RETROGRADE CHOLANGIOPANCREATOGRAPHY (ERCP) WITH PROPOFOL (N/A ) SPHINCTEROTOMY REMOVAL OF STONES     Patient location during evaluation: PACU Anesthesia Type: General Level of consciousness: sedated Pain management: pain level controlled Vital Signs Assessment: post-procedure vital signs reviewed and stable Respiratory status: spontaneous breathing and respiratory function stable Cardiovascular status: stable Postop Assessment: no apparent nausea or vomiting Anesthetic complications: no    Last Vitals:  Vitals:   05/01/18 1519 05/01/18 1530  BP: (!) 164/91 (!) 167/88  Pulse: 82 81  Resp: 10 13  Temp: 37.1 C   SpO2: 98% 93%    Last Pain:  Vitals:   05/01/18 1519  TempSrc: Oral  PainSc: 0-No pain                 Anaysia Germer DANIEL

## 2018-05-01 NOTE — Anesthesia Procedure Notes (Signed)
Procedure Name: Intubation Date/Time: 05/01/2018 2:30 PM Performed by: Julian ReilWelty, Lakecia Deschamps F, CRNA Pre-anesthesia Checklist: Patient identified, Emergency Drugs available, Suction available, Patient being monitored and Timeout performed Patient Re-evaluated:Patient Re-evaluated prior to induction Oxygen Delivery Method: Circle system utilized Preoxygenation: Pre-oxygenation with 100% oxygen Induction Type: IV induction Ventilation: Mask ventilation without difficulty Laryngoscope Size: Miller and 3 Grade View: Grade I Tube type: Oral Tube size: 7.0 mm Number of attempts: 1 Airway Equipment and Method: Stylet Placement Confirmation: ETT inserted through vocal cords under direct vision,  positive ETCO2 and breath sounds checked- equal and bilateral Secured at: 22 cm Tube secured with: Tape Dental Injury: Teeth and Oropharynx as per pre-operative assessment

## 2018-05-01 NOTE — H&P (View-Only) (Signed)
Case discussed with Dr. Stark who has agreed to do the ERCP for CBD stones. Explained this to the patient. I explained the ERCP. I explained the potential risks of bleeding, infection, perforation, and pancreatitis. She understands and is agreeable to have procedure done. Appreciate Dr. Stark's help. 

## 2018-05-01 NOTE — Progress Notes (Signed)
NCM spoke to pt and she has insurance. Son to bring her card. No NCM needs identified. Isidoro DonningAlesia Willodene Stallings RN CCM Case Mgmt phone [phone]

## 2018-05-01 NOTE — Progress Notes (Signed)
PROGRESS NOTE    Cindy Hall  QMV:[id number] DOB: 13-Dec-1965 DOA: 04/30/2018 PCP: Ernestina PennaMoore, Donald W, MD  Outpatient Specialists:   Brief Narrative:  Cindy Perchesenny B Deeley is a 52 y.o. female with no significant past medical history [facility].    Patient underwent MRCP of the abdomen done revealed 7 mm distal common bile duct stone and stricture.  ERCP is planned for in the morning.  The plan is to proceed with cholecystectomy after the ERCP.  GI and surgical team to assisted in directing patient's care.  Assessment & Plan:   Active Problems:   Choledocholithiasis with acute cholecystitis   Choledocholithiasis   Elevated LFTs   Choledocholithiasis with acute cholecystitis: -[facility] worsening RUQ/midepigastric/back pain -[facility] with probable choledocholithiasis -Will keep patient NPO -Morphine for pain, Zofran for nausea -GI has been consulted, as she is likely to need ERCP -Will also consult surgery, as she is likely to need cholecystectomy -Empiric coverage with Zosyn for now -Will check CBC/CMP now and in AM  05/01/2018: Patient remains afebrile.  No leukocytosis. No significant abdominal pain. Query if patient has passed the stone. ERCP is still planned for in the morning, as per GI team. Continue IV antibiotics for now.  Moderate obesity: Diet and exercise.   DVT prophylaxis:  SCDs pending procedures Code Status:  Full - confirmed with patient Family Communication: None present Disposition Plan:  Home once clinically improved Consults called: GI/Surgery    Procedures:   Possible ERCP in the morning  Antimicrobials:   IV Zosyn   Subjective: No complaints. No fever or chills. No abdominal pain No nausea vomiting.  Objective: Vitals:   05/01/18 1322 05/01/18 1519 05/01/18 1530 05/01/18 1958  BP: (!) 164/86 (!) 164/91 (!) 167/88  139/69  Pulse: 64 82 81 65  Resp: 15 10 13 18   Temp: 98.4 F (36.9 C) 98.8 F (37.1 C)  97.6 F (36.4 C)  TempSrc: Oral Oral  Oral  SpO2: 97% 98% 93% 94%  Weight:      Height:        Intake/Output Summary (Last 24 hours) at 05/01/2018 2315 Last data filed at 05/01/2018 1826 Gross per 24 hour  Intake 2280.11 ml  Output -  Net 2280.11 ml   Filed Weights   05/01/18 0342  Weight: 95.1 kg    Examination:  General exam: Appears calm and comfortable.  Obese Respiratory system: Clear to auscultation. Respiratory effort normal. Cardiovascular system: S1 & S2 heard.   Gastrointestinal system: Abdomen is obese, soft and nontender.  Negative Murphy's sign.  Organs are not palpable.  Central nervous system: Alert and oriented. No focal neurological deficits. Extremities: No leg edema.  Psychiatry: Judgement and insight appear normal. Mood & affect appropriate.   Data Reviewed: I have personally reviewed following labs and imaging studies  CBC: Recent Labs  Lab 04/30/18 1054 05/01/18 0447  WBC 8.8 5.2  NEUTROABS 7.5  --   HGB 14.8 13.6  HCT 45.2 41.2  MCV 90.6 90.9  PLT 185 159   Basic Metabolic Panel: Recent Labs  Lab 04/30/18 1054 05/01/18 0447  NA 142 143  K 3.6 3.5  CL 105 107  CO2 28 28  GLUCOSE 116* 90  BUN 13 15  CREATININE 0.88 0.88  CALCIUM 9.2 8.9   GFR: Estimated Creatinine Clearance: 78.6 mL/min (by C-G formula based on SCr of 0.88 mg/dL). Liver Function Tests: Recent Labs  Lab 04/30/18 1054 05/01/18 0447  AST 125* 67*  ALT 132* 115*  ALKPHOS 145* 150*  BILITOT 3.6* 2.1*  PROT 6.9 6.1*  ALBUMIN 3.3* 2.8*   Recent Labs  Lab 05/01/18 1245  LIPASE 21   No results for input(s): AMMONIA in the last 168 hours. Coagulation Profile: Recent Labs  Lab 04/30/18 1054  INR 1.10   Cardiac Enzymes: No results for input(s): CKTOTAL, CKMB, CKMBINDEX, TROPONINI in the last 168 hours. BNP (last 3 results) No results for input(s): PROBNP in the last  8760 hours. HbA1C: No results for input(s): HGBA1C in the last 72 hours. CBG: No results for input(s): GLUCAP in the last 168 hours. Lipid Profile: No results for input(s): CHOL, HDL, LDLCALC, TRIG, CHOLHDL, LDLDIRECT in the last 72 hours. Thyroid Function Tests: No results for input(s): TSH, T4TOTAL, FREET4, T3FREE, THYROIDAB in the last 72 hours. Anemia Panel: No results for input(s): VITAMINB12, FOLATE, FERRITIN, TIBC, IRON, RETICCTPCT in the last 72 hours. Urine analysis: No results found for: COLORURINE, APPEARANCEUR, LABSPEC, PHURINE, GLUCOSEU, HGBUR, BILIRUBINUR, KETONESUR, PROTEINUR, UROBILINOGEN, NITRITE, LEUKOCYTESUR Sepsis Labs: @LABRCNTIP (procalcitonin:4,lacticidven:4)  )No results found for this or any previous visit (from the past 240 hour(s)).       Radiology Studies: Mr 3d Recon At Scanner  Result Date: 04/30/2018 CLINICAL DATA:  Right upper quadrant pain and nausea for 4 days. Acute cholecystitis and choledocholithiasis. EXAM: MRI ABDOMEN WITHOUT AND WITH CONTRAST (INCLUDING MRCP) TECHNIQUE: Multiplanar multisequence MR imaging of the abdomen was performed both before and after the administration of intravenous contrast. Heavily T2-weighted images of the biliary and pancreatic ducts were obtained, and three-dimensional MRCP images were rendered by post processing. CONTRAST:  18mL MULTIHANCE GADOBENATE DIMEGLUMINE 529 MG/ML IV SOLN COMPARISON:  None. FINDINGS: Lower Chest: Mild right basilar atelectasis. Hepatobiliary: No hepatic masses identified. A few tiny sub-cm cysts are noted in the left hepatic lobe. Tiny gallstones are seen. The gallbladder shows diffuse wall thickening with contrast enhancement and pericholecystic inflammatory changes, consistent with acute cholecystitis. Mild diffuse biliary ductal dilatation is seen, with common bile duct measuring 8 mm in diameter. At least 1 stone is seen in the distal common bile duct measuring 7 mm. There is also smooth  narrowing of the distal common bile duct just above the ampulla which may be due to a distal common bile duct stricture. Pancreas: No mass or inflammatory changes. No evidence of pancreatic ductal dilatation or pancreas divisum Spleen: Within normal limits in size and appearance. Adrenals/Urinary Tract: No masses identified. Small simple right renal cyst noted. No evidence of hydronephrosis. Stomach/Bowel: No evidence of obstruction, inflammatory process or abnormal fluid collections. Vascular/Lymphatic: No pathologically enlarged lymph nodes. No abdominal aortic aneurysm. Reproductive:  No mass or other significant abnormality. Other:  None. Musculoskeletal:  No suspicious bone lesions identified. IMPRESSION: Acute cholecystitis. Mild biliary ductal dilatation with 7 mm stone in the distal common bile duct, and possible distal common bile duct stricture. No evidence of pancreatic mass or pancreatic ductal dilatation. Electronically Signed   By: Myles Rosenthal M.D.   On: 04/30/2018 20:07   Dg Ercp Biliary & Pancreatic Ducts  Result Date: 05/01/2018 CLINICAL DATA:  Bile duct stone suspected on MRCP EXAM: ERCP TECHNIQUE: Multiple spot images obtained with the fluoroscopic device and submitted for interpretation post-procedure. COMPARISON:  MRCP 04/30/2018 FINDINGS: A series of fluoroscopic spot images document endoscopic cannulation and opacification of the CBD. Mobile filling defect in the distal CBD. Incomplete opacification of the intrahepatic biliary tree, appearing decompressed centrally. Subsequent images  document passage of a balloon tipped catheter through the CBD. IMPRESSION: Endoscopic CBD cannulation and intervention. Intraluminal filling defect suggesting choledocholithiasis. These images were submitted for radiologic interpretation only. Please see the procedural report for the amount of contrast and the fluoroscopy time utilized. Electronically Signed   By: Corlis Leak  Hassell M.D.   On: 05/01/2018 15:20   Mr  Abdomen Mrcp Vivien RossettiW Wo Contast  Result Date: 04/30/2018 CLINICAL DATA:  Right upper quadrant pain and nausea for 4 days. Acute cholecystitis and choledocholithiasis. EXAM: MRI ABDOMEN WITHOUT AND WITH CONTRAST (INCLUDING MRCP) TECHNIQUE: Multiplanar multisequence MR imaging of the abdomen was performed both before and after the administration of intravenous contrast. Heavily T2-weighted images of the biliary and pancreatic ducts were obtained, and three-dimensional MRCP images were rendered by post processing. CONTRAST:  18mL MULTIHANCE GADOBENATE DIMEGLUMINE 529 MG/ML IV SOLN COMPARISON:  None. FINDINGS: Lower Chest: Mild right basilar atelectasis. Hepatobiliary: No hepatic masses identified. A few tiny sub-cm cysts are noted in the left hepatic lobe. Tiny gallstones are seen. The gallbladder shows diffuse wall thickening with contrast enhancement and pericholecystic inflammatory changes, consistent with acute cholecystitis. Mild diffuse biliary ductal dilatation is seen, with common bile duct measuring 8 mm in diameter. At least 1 stone is seen in the distal common bile duct measuring 7 mm. There is also smooth narrowing of the distal common bile duct just above the ampulla which may be due to a distal common bile duct stricture. Pancreas: No mass or inflammatory changes. No evidence of pancreatic ductal dilatation or pancreas divisum Spleen: Within normal limits in size and appearance. Adrenals/Urinary Tract: No masses identified. Small simple right renal cyst noted. No evidence of hydronephrosis. Stomach/Bowel: No evidence of obstruction, inflammatory process or abnormal fluid collections. Vascular/Lymphatic: No pathologically enlarged lymph nodes. No abdominal aortic aneurysm. Reproductive:  No mass or other significant abnormality. Other:  None. Musculoskeletal:  No suspicious bone lesions identified. IMPRESSION: Acute cholecystitis. Mild biliary ductal dilatation with 7 mm stone in the distal common bile duct,  and possible distal common bile duct stricture. No evidence of pancreatic mass or pancreatic ductal dilatation. Electronically Signed   By: Myles RosenthalJohn  Stahl M.D.   On: 04/30/2018 20:07        Scheduled Meds: . [START ON 05/02/2018] indomethacin  100 mg Rectal Once  . LORazepam  1 mg Intravenous Once   Continuous Infusions: . lactated ringers Stopped (05/01/18 0359)  . piperacillin-tazobactam (ZOSYN)  IV 3.375 g (05/01/18 2130)     LOS: 1 day    Time spent: 25 Minutes    Berton MountSylvester Berlie Persky, MD  [facility] Pager #: [phone] 7PM-7AM contact night coverage as above

## 2018-05-01 NOTE — Plan of Care (Signed)
Patient stable, discussed POC with patient, agreeable with plan, ECRP today, denies question/concerns at this time.

## 2018-05-01 NOTE — Progress Notes (Addendum)
Per Dr. Luan MooreGanem's request the patient is scheduled for ERCP to address choledocholithiasis.   The risks (including pancreatitis, bleeding, perforation, infection, missed lesions, medication reactions and possible hospitalization or surgery if complications occur), benefits, and alternatives to ERCP with possible sphincterotomy, possible stent were discussed with the patient and they consent to proceed.  Pain and nausea are gone today.   Jennye MoccasinSarah Gribbin PA-C

## 2018-05-02 ENCOUNTER — Inpatient Hospital Stay (HOSPITAL_COMMUNITY): Payer: BC Managed Care – PPO | Admitting: Anesthesiology

## 2018-05-02 ENCOUNTER — Encounter (HOSPITAL_COMMUNITY)
Admission: EM | Disposition: A | Discharge status: Home / Self Care | Payer: Self-pay | Source: Other Acute Inpatient Hospital | Attending: Internal Medicine

## 2018-05-02 ENCOUNTER — Encounter (HOSPITAL_COMMUNITY): Payer: Self-pay | Admitting: Certified Registered"

## 2018-05-02 DIAGNOSIS — R945 Abnormal results of liver function studies: Secondary | ICD-10-CM

## 2018-05-02 DIAGNOSIS — K8042 Calculus of bile duct with acute cholecystitis without obstruction: Secondary | ICD-10-CM

## 2018-05-02 HISTORY — PX: CHOLECYSTECTOMY: SHX55

## 2018-05-02 LAB — COMPREHENSIVE METABOLIC PANEL
ALT: 109 U/L — ABNORMAL HIGH (ref 0–44)
AST: 61 U/L — ABNORMAL HIGH (ref 15–41)
Albumin: 2.7 g/dL — ABNORMAL LOW (ref 3.5–5.0)
Alkaline Phosphatase: 165 U/L — ABNORMAL HIGH (ref 38–126)
Anion gap: 10 (ref 5–15)
BUN: 14 mg/dL (ref 6–20)
CO2: 28 mmol/L (ref 22–32)
Calcium: 8.9 mg/dL (ref 8.9–10.3)
Chloride: 104 mmol/L (ref 98–111)
Creatinine, Ser: 0.79 mg/dL (ref 0.44–1.00)
GFR calc Af Amer: >60 mL/min (ref >60)
GFR calc non Af Amer: >60 mL/min (ref >60)
Glucose, Bld: 109 mg/dL — ABNORMAL HIGH (ref 70–99)
Potassium: 4.1 mmol/L (ref 3.5–5.1)
Sodium: 142 mmol/L (ref 135–145)
Total Bilirubin: 1.6 mg/dL — ABNORMAL HIGH (ref 0.3–1.2)
Total Protein: 6 g/dL — ABNORMAL LOW (ref 6.5–8.1)

## 2018-05-02 SURGERY — LAPAROSCOPIC CHOLECYSTECTOMY WITH INTRAOPERATIVE CHOLANGIOGRAM
Anesthesia: General | Site: Abdomen

## 2018-05-02 MED ORDER — PROPOFOL 10 MG/ML IV BOLUS
INTRAVENOUS | Status: AC
  Filled 2018-05-02: qty 20

## 2018-05-02 MED ORDER — MIDAZOLAM HCL 2 MG/2ML IJ SOLN
INTRAMUSCULAR | Status: AC
  Filled 2018-05-02: qty 2

## 2018-05-02 MED ORDER — BUPIVACAINE HCL (PF) 0.25 % IJ SOLN
INTRAMUSCULAR | Status: AC
  Filled 2018-05-02: qty 30

## 2018-05-02 MED ORDER — IOPAMIDOL (ISOVUE-300) INJECTION 61%
INTRAVENOUS | Status: AC
  Filled 2018-05-02: qty 50

## 2018-05-02 MED ORDER — 0.9 % SODIUM CHLORIDE (POUR BTL) OPTIME
TOPICAL | Status: DC | PRN
  Administered 2018-05-02: 1000 mL

## 2018-05-02 MED ORDER — FENTANYL CITRATE (PF) 100 MCG/2ML IJ SOLN
25.0000 ug | INTRAMUSCULAR | Status: DC | PRN
  Administered 2018-05-02 (×2): 25 ug via INTRAVENOUS

## 2018-05-02 MED ORDER — FENTANYL CITRATE (PF) 100 MCG/2ML IJ SOLN
INTRAMUSCULAR | Status: AC
  Administered 2018-05-02: 15:00:00
  Filled 2018-05-02: qty 2

## 2018-05-02 MED ORDER — SODIUM CHLORIDE 0.9 % IR SOLN
Status: DC | PRN
  Administered 2018-05-02: 1000 mL

## 2018-05-02 MED ORDER — FENTANYL CITRATE (PF) 100 MCG/2ML IJ SOLN
INTRAMUSCULAR | Status: DC | PRN
  Administered 2018-05-02: 100 ug via INTRAVENOUS
  Administered 2018-05-02 (×3): 50 ug via INTRAVENOUS

## 2018-05-02 MED ORDER — TRAMADOL HCL 50 MG PO TABS: 50.0000 mg | ORAL_TABLET | Freq: Four times a day (QID) | ORAL | Status: DC | PRN

## 2018-05-02 MED ORDER — LACTATED RINGERS IV SOLN
INTRAVENOUS | Status: DC | PRN
  Administered 2018-05-02: 11:00:00 via INTRAVENOUS

## 2018-05-02 MED ORDER — FENTANYL CITRATE (PF) 250 MCG/5ML IJ SOLN
INTRAMUSCULAR | Status: AC
  Filled 2018-05-02: qty 5

## 2018-05-02 MED ORDER — ROCURONIUM BROMIDE 100 MG/10ML IV SOLN
INTRAVENOUS | Status: DC | PRN
  Administered 2018-05-02: 50 mg via INTRAVENOUS

## 2018-05-02 MED ORDER — PROPOFOL 10 MG/ML IV BOLUS
INTRAVENOUS | Status: DC | PRN
  Administered 2018-05-02: 200 mg via INTRAVENOUS
  Administered 2018-05-02: 100 mg via INTRAVENOUS

## 2018-05-02 MED ORDER — HYDROMORPHONE HCL 1 MG/ML IJ SOLN
1.0000 mg | INTRAMUSCULAR | Status: DC | PRN
  Administered 2018-05-02 – 2018-05-03 (×3): 1 mg via INTRAVENOUS
  Filled 2018-05-02 (×3): qty 1

## 2018-05-02 MED ORDER — LIDOCAINE 2% (20 MG/ML) 5 ML SYRINGE
INTRAMUSCULAR | Status: DC | PRN
  Administered 2018-05-02: 80 mg via INTRAVENOUS

## 2018-05-02 MED ORDER — ONDANSETRON HCL 4 MG/2ML IJ SOLN: 4.0000 mg | Freq: Four times a day (QID) | INTRAMUSCULAR | Status: DC | PRN

## 2018-05-02 MED ORDER — ONDANSETRON HCL 4 MG/2ML IJ SOLN
INTRAMUSCULAR | Status: DC | PRN
  Administered 2018-05-02: 4 mg via INTRAVENOUS

## 2018-05-02 MED ORDER — DEXTROSE-NACL 5-0.9 % IV SOLN
INTRAVENOUS | Status: DC
  Administered 2018-05-02: 16:00:00 via INTRAVENOUS

## 2018-05-02 MED ORDER — ONDANSETRON 4 MG PO TBDP: 4.0000 mg | ORAL_TABLET | Freq: Four times a day (QID) | ORAL | Status: DC | PRN

## 2018-05-02 MED ORDER — OXYCODONE HCL 5 MG PO TABS: 5.0000 mg | ORAL_TABLET | Freq: Once | ORAL | Status: DC | PRN

## 2018-05-02 MED ORDER — SCOPOLAMINE 1 MG/3DAYS TD PT72
MEDICATED_PATCH | TRANSDERMAL | Status: DC | PRN
  Administered 2018-05-02: 1 via TRANSDERMAL

## 2018-05-02 MED ORDER — MIDAZOLAM HCL 5 MG/5ML IJ SOLN
INTRAMUSCULAR | Status: DC | PRN
  Administered 2018-05-02: 2 mg via INTRAVENOUS

## 2018-05-02 MED ORDER — BUPIVACAINE HCL 0.25 % IJ SOLN
INTRAMUSCULAR | Status: DC | PRN
  Administered 2018-05-02: 15 mL
  Administered 2018-05-02: 10 mL

## 2018-05-02 MED ORDER — SCOPOLAMINE 1 MG/3DAYS TD PT72
MEDICATED_PATCH | TRANSDERMAL | Status: AC
  Filled 2018-05-02: qty 1

## 2018-05-02 MED ORDER — OXYCODONE HCL 5 MG/5ML PO SOLN: 5.0000 mg | Freq: Once | ORAL | Status: DC | PRN

## 2018-05-02 MED ORDER — PROMETHAZINE HCL 25 MG/ML IJ SOLN: 6.2500 mg | INTRAMUSCULAR | Status: DC | PRN

## 2018-05-02 MED ORDER — SUGAMMADEX SODIUM 200 MG/2ML IV SOLN
INTRAVENOUS | Status: DC | PRN
  Administered 2018-05-02: 350 mg via INTRAVENOUS

## 2018-05-02 MED ORDER — DEXAMETHASONE SODIUM PHOSPHATE 4 MG/ML IJ SOLN
INTRAMUSCULAR | Status: DC | PRN
  Administered 2018-05-02: 8 mg via INTRAVENOUS

## 2018-05-02 SURGICAL SUPPLY — 47 items
ADH SKN CLS APL DERMABOND .7 (GAUZE/BANDAGES/DRESSINGS) ×1
APPLIER CLIP 5 13 M/L LIGAMAX5 (MISCELLANEOUS)
APR CLP MED LRG 5 ANG JAW (MISCELLANEOUS)
BAG SPEC RTRVL 10 TROC 200 (ENDOMECHANICALS)
CANISTER SUCT 3000ML PPV (MISCELLANEOUS) ×2 IMPLANT
CHLORAPREP W/TINT 26ML (MISCELLANEOUS) ×2 IMPLANT
CLIP APPLIE 5 13 M/L LIGAMAX5 (MISCELLANEOUS) IMPLANT
CLIP VESOLOCK MED LG 6/CT (CLIP) ×3 IMPLANT
COVER MAYO STAND STRL (DRAPES) ×2 IMPLANT
COVER SURGICAL LIGHT HANDLE (MISCELLANEOUS) ×2 IMPLANT
COVER TRANSDUCER ULTRASND (DRAPES) ×2 IMPLANT
DEFOGGER SCOPE WARMER CLEARIFY (MISCELLANEOUS) ×2 IMPLANT
DERMABOND ADVANCED (GAUZE/BANDAGES/DRESSINGS) ×1
DERMABOND ADVANCED .7 DNX12 (GAUZE/BANDAGES/DRESSINGS) ×1 IMPLANT
DRAPE C-ARM 42X72 X-RAY (DRAPES) ×2 IMPLANT
ELECT REM PT RETURN 9FT ADLT (ELECTROSURGICAL) ×2
ELECTRODE REM PT RTRN 9FT ADLT (ELECTROSURGICAL) ×1 IMPLANT
GLOVE BIO SURGEON STRL SZ7.5 (GLOVE) ×2 IMPLANT
GOWN STRL REUS W/ TWL LRG LVL3 (GOWN DISPOSABLE) ×2 IMPLANT
GOWN STRL REUS W/ TWL XL LVL3 (GOWN DISPOSABLE) ×1 IMPLANT
GOWN STRL REUS W/TWL LRG LVL3 (GOWN DISPOSABLE) ×4
GOWN STRL REUS W/TWL XL LVL3 (GOWN DISPOSABLE) ×2
GRASPER SUT TROCAR 14GX15 (MISCELLANEOUS) ×2 IMPLANT
IV CATH 14GX2 1/4 (CATHETERS) ×2 IMPLANT
KIT BASIN OR (CUSTOM PROCEDURE TRAY) ×2 IMPLANT
KIT TURNOVER KIT B (KITS) ×2 IMPLANT
NDL INSUFFLATION 14GA 120MM (NEEDLE) ×1 IMPLANT
NEEDLE INSUFFLATION 14GA 120MM (NEEDLE) ×2 IMPLANT
NS IRRIG 1000ML POUR BTL (IV SOLUTION) ×2 IMPLANT
PAD ARMBOARD 7.5X6 YLW CONV (MISCELLANEOUS) ×4 IMPLANT
POUCH LAPAROSCOPIC INSTRUMENT (MISCELLANEOUS) ×2 IMPLANT
POUCH RETRIEVAL ECOSAC 10 (ENDOMECHANICALS) IMPLANT
POUCH RETRIEVAL ECOSAC 10MM (ENDOMECHANICALS)
SCISSORS LAP 5X35 DISP (ENDOMECHANICALS) ×2 IMPLANT
SET CHOLANGIOGRAPHY FRANKLIN (SET/KITS/TRAYS/PACK) ×2 IMPLANT
SET IRRIG TUBING LAPAROSCOPIC (IRRIGATION / IRRIGATOR) ×2 IMPLANT
SLEEVE ENDOPATH XCEL 5M (ENDOMECHANICALS) ×2 IMPLANT
SPECIMEN JAR SMALL (MISCELLANEOUS) ×2 IMPLANT
STOPCOCK 4 WAY LG BORE MALE ST (IV SETS) ×2 IMPLANT
SUT MNCRL AB 4-0 PS2 18 (SUTURE) ×2 IMPLANT
TOWEL OR 17X24 6PK STRL BLUE (TOWEL DISPOSABLE) ×2 IMPLANT
TOWEL OR 17X26 10 PK STRL BLUE (TOWEL DISPOSABLE) ×2 IMPLANT
TRAY LAPAROSCOPIC MC (CUSTOM PROCEDURE TRAY) ×2 IMPLANT
TROCAR XCEL NON-BLD 11X100MML (ENDOMECHANICALS) ×2 IMPLANT
TROCAR XCEL NON-BLD 5MMX100MML (ENDOMECHANICALS) ×2 IMPLANT
TUBING INSUFFLATION (TUBING) ×2 IMPLANT
WATER STERILE IRR 1000ML POUR (IV SOLUTION) ×2 IMPLANT

## 2018-05-02 NOTE — Op Note (Signed)
05/02/2018  2:25 PM  PATIENT:  Cindy PerchesPenny B Entwistle  52 y.o. female  PRE-OPERATIVE DIAGNOSIS:  choledocholithiasis and cholecystitis  POST-OPERATIVE DIAGNOSIS:  choledocholithiasis and cholecystitis  PROCEDURE:  Procedure(s): LAPAROSCOPIC CHOLECYSTECTOMY  SURGEON:  Surgeon(s) and Role:    * Axel Filleramirez, Wolfgang Finigan, MD - Primary  ANESTHESIA:   local and general  EBL:  minimal   BLOOD ADMINISTERED:none  DRAINS: none   LOCAL MEDICATIONS USED:  BUPIVICAINE   SPECIMEN:  Source of Specimen:  gallbladder  DISPOSITION OF SPECIMEN:  PATHOLOGY  COUNTS:  YES  TOURNIQUET:  * No tourniquets in log *  DICTATION: .Dragon Dictation The patient was taken to the operating and placed in the supine position with bilateral SCDs in place.  The patient was prepped and draped in the usual sterile fashion. A time out was called and all facts were verified. A pneumoperitoneum was obtained via A Veress needle technique to a pressure of 14mm of mercury.  A 5mm trochar was then placed in the right upper quadrant under visualization, and there were no injuries to any abdominal organs. A 11 mm port was then placed in the umbilical region after infiltrating with local anesthesia under direct visualization. A second and third epigastric port and right lower quadrant port placement under direct visualization, respectively.  T  The gallbladder was identified and retracted, the peritoneum was then sharply dissected from the gallbladder and this dissection was carried down to Calot's triangle. The gallbladder was identified and stripped away circumferentially and seen going into the gallbladder 360, the critical angle was obtained.  2 clips were placed proximally one distally and the cystic duct transected. The cystic artery was identified and 2 clips placed proximally and one distally and transected.  We then proceeded to remove the gallbladder off the hepatic fossa with Bovie cautery. A retrieval bag was then placed in the  abdomen and gallbladder placed in the bag. The hepatic fossa was then reexamined and hemostasis was achieved with Bovie cautery and was excellent at the end of the case.   The subhepatic fossa and perihepatic fossa was then irrigated until the effluent was clear.  The gallbladder and bag were removed from the abdominal cavity. The 11 mm trocar fascia was reapproximated with the Endo Close #1 Vicryl x2.  The pneumoperitoneum was evacuated and all trochars removed under direct visulalization.  The skin was then closed with 4-0 Monocryl and the skin dressed with Dermabond.    The patient was awaken from general anesthesia and taken to the recovery room in stable condition.   PLAN OF CARE: Admit to inpatient   PATIENT DISPOSITION:  PACU - hemodynamically stable.   Delay start of Pharmacological VTE agent (>24hrs) due to surgical blood loss or risk of bleeding: not applicable

## 2018-05-02 NOTE — Anesthesia Postprocedure Evaluation (Signed)
Anesthesia Post Note  Patient: Cindy Hall  Procedure(s) Performed: LAPAROSCOPIC CHOLECYSTECTOMY WITH POSSIBLE INTRAOPERATIVE CHOLANGIOGRAM (N/A Abdomen)     Patient location during evaluation: PACU Anesthesia Type: General Level of consciousness: awake and alert Pain management: pain level controlled Vital Signs Assessment: post-procedure vital signs reviewed and stable Respiratory status: spontaneous breathing, nonlabored ventilation, respiratory function stable and patient connected to nasal cannula oxygen Cardiovascular status: blood pressure returned to baseline and stable Postop Assessment: no apparent nausea or vomiting Anesthetic complications: no    Last Vitals:  Vitals:   05/02/18 1552 05/02/18 1700  BP: (!) 174/104 (!) 159/99  Pulse: (!) 56 61  Resp: 17 20  Temp:  37.2 C  SpO2: 92% 94%    Last Pain:  Vitals:   05/02/18 1700  TempSrc: Oral  PainSc:                  Beryle Lathehomas E Abrahan Fulmore

## 2018-05-02 NOTE — Transfer of Care (Signed)
Immediate Anesthesia Transfer of Care Note  Patient: Cindy Hall  Procedure(s) Performed: LAPAROSCOPIC CHOLECYSTECTOMY WITH POSSIBLE INTRAOPERATIVE CHOLANGIOGRAM (N/A Abdomen)  Patient Location: PACU  Anesthesia Type:General  Level of Consciousness: awake, oriented and patient cooperative  Airway & Oxygen Therapy: Patient Spontanous Breathing and Patient connected to face mask oxygen  Post-op Assessment: Report given to RN and Post -op Vital signs reviewed and stable  Post vital signs: Reviewed and stable  Last Vitals:  Vitals Value Taken Time  BP 170/85 05/02/2018  2:50 PM  Temp    Pulse 64 05/02/2018  2:55 PM  Resp 23 05/02/2018  2:55 PM  SpO2 100 % 05/02/2018  2:55 PM  Vitals shown include unvalidated device data.  Last Pain:  Vitals:   05/02/18 0836  TempSrc: Oral  PainSc:          Complications: No apparent anesthesia complications

## 2018-05-02 NOTE — Progress Notes (Signed)
PROGRESS NOTE    Cindy Hall  SAY:[id number] DOB: 04-23-1966 DOA: 04/30/2018 PCP: Ernestina Penna, MD    Brief Narrative:  51 y.o. female with no significant past medical history presenting as a transfer from [facility] for acute cholecystitis/choledocholithiasis.   Monday, she felt sick to her stomach like she ate something bad. She had some back pain but thought it was related to her chair at work.  The back pain worsened overnight and is constant in the middle of her back.  She called 911 and she went to [facility]; she had a cardiac evaluation with negative troponin, negative EKG, and negative CTA chest and was discharged.  She took 1 Phenergan but her nausea subsided and she didn't need anymore.  She took 3 doses of Ibuprofen for pain.  Tuesday, she just laid around.  Wednesday, she felt a little better but then she got chills in the middle of the day.  Last night, about 930pm, she noticed chest pain.  She broke out into a sweat and again called 911.   She went back to [facility].  The doctor came in and told her that her liver and WBC didn't look good so he "did the gallbladder thing and he came in with the bad news."  She is currently having ongoing severe back pain - she does not normally have back pain.  Mild nausea.  No vomiting.   ED Course:  CT A/P concerning for acute cholecystitis and choledocholithiasis with mild CBD dilatation.  Started on IVF and Zosyn.  Assessment & Plan:   Active Problems:   Choledocholithiasis with acute cholecystitis   Choledocholithiasis   Elevated LFTs    Choledocholithiasis with acute cholecystitis  -Initially presented to [facility] with worsening RUQ/midepigastric/back pain -Imaging from [facility] indicates acute cholecystitis with probable choledocholithiasis -GI and general surgery consulted -Pt is s/p ERCP with stone extraction on 8/16 -Pt now s/p cholecystectomy 8/17 -Had been continued on empiric zosyn -Will repeat cmp in AM  Elevated BP -BP currently  159/99 -likely higher secondary to stress reaction vs pain post op -Continue to monitor  DVT prophylaxis: SCD's Code Status: Full Family Communication: Pt in room, family not at bedside Disposition Plan: Uncertain at this time  Consultants:   General Surgery   GI  Procedures:   ERCP  Cholecystectomy  Antimicrobials: Anti-infectives (From admission, onward)   Start     Dose/Rate Route Frequency Ordered Stop   04/30/18 2100  piperacillin-tazobactam (ZOSYN) IVPB 3.375 g     3.375 g 12.5 mL/hr over 240 Minutes Intravenous Every 8 hours 04/30/18 1519     04/30/18 0945  piperacillin-tazobactam (ZOSYN) IVPB 3.375 g  Status:  Discontinued     3.375 g 12.5 mL/hr over 240 Minutes Intravenous Every 8 hours 04/30/18 0935 04/30/18 1519       Subjective: Complaining of post-op pain, otherwise without complaints  Objective: Vitals:   05/02/18 1545 05/02/18 1550 05/02/18 1552 05/02/18 1700  BP:  (!) 178/108 (!) 174/104 (!) 159/99  Pulse: (!) 57 (!) 59 (!) 56 61  Resp: 20 (!) 21 17 20   Temp:    98.9 F (37.2 C)  TempSrc:    Oral  SpO2: 93% 93% 92% 94%  Weight:      Height:        Intake/Output Summary (Last 24 hours) at 05/02/2018 1850 Last data filed at 05/02/2018 1700 Gross per 24 hour  Intake 1253.28 ml  Output -  Net 1253.28 ml   [employer]  05/01/18 0342  Weight: 95.1 kg    Examination:  General exam: Appears calm and comfortable  Respiratory system: Clear to auscultation. Respiratory effort normal. Cardiovascular system: S1 & S2 heard, RRR.  Gastrointestinal system: Abdomen is nondistended, soft and nontender. No organomegaly or masses felt. Normal bowel sounds heard. Central nervous system: Alert and oriented. No focal neurological deficits. Extremities: Symmetric 5 x 5 power. Skin: No rashes, lesions  Psychiatry: Judgement and insight appear normal. Mood & affect appropriate.   Data Reviewed: I have personally reviewed following labs and imaging  studies  CBC: Recent Labs  Lab 04/30/18 1054 05/01/18 0447  WBC 8.8 5.2  NEUTROABS 7.5  --   HGB 14.8 13.6  HCT 45.2 41.2  MCV 90.6 90.9  PLT 185 159   Basic Metabolic Panel: Recent Labs  Lab 04/30/18 1054 05/01/18 0447 05/02/18 0542  NA 142 143 142  K 3.6 3.5 4.1  CL 105 107 104  CO2 28 28 28   GLUCOSE 116* 90 109*  BUN 13 15 14   CREATININE 0.88 0.88 0.79  CALCIUM 9.2 8.9 8.9   GFR: Estimated Creatinine Clearance: 86.4 mL/min (by C-G formula based on SCr of 0.79 mg/dL). Liver Function Tests: Recent Labs  Lab 04/30/18 1054 05/01/18 0447 05/02/18 0542  AST 125* 67* 61*  ALT 132* 115* 109*  ALKPHOS 145* 150* 165*  BILITOT 3.6* 2.1* 1.6*  PROT 6.9 6.1* 6.0*  ALBUMIN 3.3* 2.8* 2.7*   Recent Labs  Lab 05/01/18 1245  LIPASE 21   No results for input(s): AMMONIA in the last 168 hours. Coagulation Profile: Recent Labs  Lab 04/30/18 1054  INR 1.10   Cardiac Enzymes: No results for input(s): CKTOTAL, CKMB, CKMBINDEX, TROPONINI in the last 168 hours. BNP (last 3 results) No results for input(s): PROBNP in the last 8760 hours. HbA1C: No results for input(s): HGBA1C in the last 72 hours. CBG: No results for input(s): GLUCAP in the last 168 hours. Lipid Profile: No results for input(s): CHOL, HDL, LDLCALC, TRIG, CHOLHDL, LDLDIRECT in the last 72 hours. Thyroid Function Tests: No results for input(s): TSH, T4TOTAL, FREET4, T3FREE, THYROIDAB in the last 72 hours. Anemia Panel: No results for input(s): VITAMINB12, FOLATE, FERRITIN, TIBC, IRON, RETICCTPCT in the last 72 hours. Sepsis Labs: No results for input(s): PROCALCITON, LATICACIDVEN in the last 168 hours.  No results found for this or any previous visit (from the past 240 hour(s)).   Radiology Studies: Dg Ercp Biliary & Pancreatic Ducts  Result Date: 05/01/2018 CLINICAL DATA:  Bile duct stone suspected on MRCP EXAM: ERCP TECHNIQUE: Multiple spot images obtained with the fluoroscopic device and  submitted for interpretation post-procedure. COMPARISON:  MRCP 04/30/2018 FINDINGS: A series of fluoroscopic spot images document endoscopic cannulation and opacification of the CBD. Mobile filling defect in the distal CBD. Incomplete opacification of the intrahepatic biliary tree, appearing decompressed centrally. Subsequent images document passage of a balloon tipped catheter through the CBD. IMPRESSION: Endoscopic CBD cannulation and intervention. Intraluminal filling defect suggesting choledocholithiasis. These images were submitted for radiologic interpretation only. Please see the procedural report for the amount of contrast and the fluoroscopy time utilized. Electronically Signed   By: Corlis Leak  Hassell M.D.   On: 05/01/2018 15:20    Scheduled Meds: . fentaNYL      . LORazepam  1 mg Intravenous Once   Continuous Infusions: . dextrose 5 % and 0.9% NaCl 100 mL/hr at 05/02/18 1700  . lactated ringers Stopped (05/02/18 1046)  . piperacillin-tazobactam (ZOSYN)  IV Stopped (05/02/18 0934)  LOS: 2 days   Rickey BarbaraStephen Talal Fritchman, MD [facility] Pager [phone]  If 7PM-7AM, please contact night-coverage [email or web address] Password [email or web address] 05/02/2018, 6:50 PM

## 2018-05-02 NOTE — Anesthesia Procedure Notes (Signed)
Procedure Name: Intubation Date/Time: 05/02/2018 1:40 PM Performed by: Julian ReilWelty, Stina Gane F, CRNA Pre-anesthesia Checklist: Patient identified, Emergency Drugs available, Suction available, Patient being monitored and Timeout performed Patient Re-evaluated:Patient Re-evaluated prior to induction Oxygen Delivery Method: Circle system utilized Preoxygenation: Pre-oxygenation with 100% oxygen Induction Type: IV induction Ventilation: Mask ventilation without difficulty Laryngoscope Size: Miller and 3 Grade View: Grade II Tube type: Oral Tube size: 7.0 mm Number of attempts: 1 Airway Equipment and Method: Stylet Placement Confirmation: ETT inserted through vocal cords under direct vision,  breath sounds checked- equal and bilateral and positive ETCO2 Secured at: 20 cm Tube secured with: Tape Dental Injury: Teeth and Oropharynx as per pre-operative assessment  Comments: 4x4s bite block used.

## 2018-05-02 NOTE — Anesthesia Preprocedure Evaluation (Addendum)
Anesthesia Evaluation  Patient identified by MRN, date of birth, ID band Patient awake    Reviewed: Allergy & Precautions, NPO status , Patient's Chart, lab work & pertinent test results  History of Anesthesia Complications Negative for: history of anesthetic complications  Airway Mallampati: II  TM Distance: >3 FB Neck ROM: Full    Dental  (+) Dental Advisory Given, Teeth Intact   Pulmonary neg pulmonary ROS,    breath sounds clear to auscultation       Cardiovascular negative cardio ROS   Rhythm:Regular Rate:Normal     Neuro/Psych negative neurological ROS  negative psych ROS   GI/Hepatic Neg liver ROS,  Choledocholithiasis with acute cholecystitis     Endo/Other   Obesity   Renal/GU negative Renal ROS  negative genitourinary   Musculoskeletal negative musculoskeletal ROS (+)   Abdominal (+) + obese,   Peds  Hematology negative hematology ROS (+)   Anesthesia Other Findings   Reproductive/Obstetrics                            Anesthesia Physical Anesthesia Plan  ASA: II  Anesthesia Plan: General   Post-op Pain Management:    Induction: Intravenous  PONV Risk Score and Plan: 4 or greater and Treatment may vary due to age or medical condition, Ondansetron, Scopolamine patch - Pre-op, Dexamethasone and Midazolam  Airway Management Planned: Oral ETT  Additional Equipment: None  Intra-op Plan:   Post-operative Plan: Extubation in OR  Informed Consent: I have reviewed the patients History and Physical, chart, labs and discussed the procedure including the risks, benefits and alternatives for the proposed anesthesia with the patient or authorized representative who has indicated his/her understanding and acceptance.   Dental advisory given  Plan Discussed with: CRNA and Anesthesiologist  Anesthesia Plan Comments:        Anesthesia Quick Evaluation

## 2018-05-03 ENCOUNTER — Encounter (HOSPITAL_COMMUNITY): Payer: Self-pay | Admitting: General Surgery

## 2018-05-03 LAB — COMPREHENSIVE METABOLIC PANEL
ALT: 85 U/L — ABNORMAL HIGH (ref 0–44)
AST: 35 U/L (ref 15–41)
Albumin: 2.8 g/dL — ABNORMAL LOW (ref 3.5–5.0)
Alkaline Phosphatase: 126 U/L (ref 38–126)
Anion gap: 7 (ref 5–15)
BUN: 10 mg/dL (ref 6–20)
CO2: 29 mmol/L (ref 22–32)
Calcium: 8.5 mg/dL — ABNORMAL LOW (ref 8.9–10.3)
Chloride: 106 mmol/L (ref 98–111)
Creatinine, Ser: 0.9 mg/dL (ref 0.44–1.00)
GFR calc Af Amer: >60 mL/min (ref >60)
GFR calc non Af Amer: >60 mL/min (ref >60)
Glucose, Bld: 145 mg/dL — ABNORMAL HIGH (ref 70–99)
Potassium: 4 mmol/L (ref 3.5–5.1)
Sodium: 142 mmol/L (ref 135–145)
Total Bilirubin: 1 mg/dL (ref 0.3–1.2)
Total Protein: 6 g/dL — ABNORMAL LOW (ref 6.5–8.1)

## 2018-05-03 MED ORDER — TRAMADOL HCL 50 MG PO TABS: 50.0000 mg | ORAL_TABLET | Freq: Four times a day (QID) | ORAL | 0 refills | Status: DC | PRN

## 2018-05-03 MED ORDER — TRAMADOL HCL 50 MG PO TABS
50.0000 mg | ORAL_TABLET | Freq: Four times a day (QID) | ORAL | Status: DC | PRN
  Administered 2018-05-03: 50 mg via ORAL
  Filled 2018-05-03: qty 1

## 2018-05-03 NOTE — Progress Notes (Signed)
Nsg Discharge Note  Admit Date:  04/30/2018 Discharge date: 05/03/2018   Cindy Hall to be D/C'd Home per MD order.  AVS completed.  Copy for chart, and copy for patient signed, and dated. Patient/caregiver able to verbalize understanding.  Discharge Medication: Allergies as of 05/03/2018   No Known Allergies     Medication List    TAKE these medications   ibuprofen 400 MG tablet Commonly known as:  ADVIL,MOTRIN Take 400 mg by mouth every 6 (six) hours as needed for pain.   multivitamin with minerals Tabs tablet Take 1 tablet by mouth daily.   promethazine 25 MG tablet Commonly known as:  PHENERGAN Take 25 mg by mouth every 6 (six) hours as needed for nausea/vomiting.   traMADol 50 MG tablet Commonly known as:  ULTRAM Take 1 tablet (50 mg total) by mouth every 6 (six) hours as needed for moderate pain.       Discharge Assessment: Vitals:   05/03/18 0916 05/03/18 1207  BP: 113/80 124/82  Pulse: (!) 59 64  Resp: 18 18  Temp: 97.9 F (36.6 C) 98.2 F (36.8 C)  SpO2: 94% 94%   Skin clean, dry and intact without evidence of skin break down, no evidence of skin tears noted. IV catheter discontinued intact. Site without signs and symptoms of complications - no redness or edema noted at insertion site, patient denies c/o pain - only slight tenderness at site.  Dressing with slight pressure applied.  D/c Instructions-Education: Discharge instructions given to patient/family with verbalized understanding. D/c education completed with patient/family including follow up instructions, medication list, d/c activities limitations if indicated, with other d/c instructions as indicated by MD - patient able to verbalize understanding, all questions fully answered. Patient instructed to return to ED, call 911, or call MD for any changes in condition.  Patient escorted via WC, and D/C home via private auto.  Adair LaundryElizabeth A Ann Groeneveld, RN 05/03/2018 4:06 PM

## 2018-05-03 NOTE — Progress Notes (Signed)
Pharmacy Antibiotic Note  Cindy Hall is a 52 y.o. female admitted on 04/30/2018 with intra-abdominal infection.  Pharmacy has been consulted for Zosyn dosing.  SCr remains stable. WBC wnl -last on 8/16. S/p surgery 8/17 for lap cholecystectomy. No cultures.   Plan: Continue Zosyn 3.375g IV every 8 hours -extended infusion.  Follow-up renal function and clinical status. Follow-up length of therapy.  Height: 5\' 2"  (157.5 cm) Weight: 209 lb 10.5 oz (95.1 kg) IBW/kg (Calculated) : 50.1  Temp (24hrs), Avg:97.9 F (36.6 C), Min:97.3 F (36.3 C), Max:98.9 F (37.2 C)  Recent Labs  Lab 04/30/18 1054 05/01/18 0447 05/02/18 0542 05/03/18 0353  WBC 8.8 5.2  --   --   CREATININE 0.88 0.88 0.79 0.90    Estimated Creatinine Clearance: 76.8 mL/min (by C-G formula based on SCr of 0.9 mg/dL).    No Known Allergies  Antimicrobials this admission: Zosyn 8/15 >>  Dose adjustments this admission:   Microbiology results: None  Thank you for allowing pharmacy to be a part of this patient's care.  Link SnufferJessica Asheley Hellberg, PharmD, BCPS, BCCCP Clinical Pharmacist Clinical phone 05/03/2018 until 3:[phone] #[phone] After hours, please call #[phone] 05/03/2018 8:18 AM

## 2018-05-03 NOTE — Progress Notes (Signed)
  Progress Note: General Surgery Service   Assessment/Plan: Active Problems:   Choledocholithiasis with acute cholecystitis   Choledocholithiasis   Elevated LFTs  s/p Procedure(s): LAPAROSCOPIC CHOLECYSTECTOMY WITH POSSIBLE INTRAOPERATIVE CHOLANGIOGRAM 05/02/2018 -advance diet -ok to discharge from surgery standpoint if tolerates lunch -pain med prescribed    LOS: 3 days  Chief Complaint/Subjective: Pain minimal, tolerated liquids, no nausea  Objective: Vital signs in last 24 hours: Temp:  [97.3 F (36.3 C)-98.9 F (37.2 C)] 97.9 F (36.6 C) (08/18 0916) Pulse Rate:  [50-70] 59 (08/18 0916) Resp:  [12-21] 18 (08/18 0916) BP: (113-182)/(69-108) 113/80 (08/18 0916) SpO2:  [92 %-100 %] 94 % (08/18 0916) Last BM Date: 05/02/18  Intake/Output from previous day: 08/17 0701 - 08/18 0700 In: 2138.7 [I.V.:1942.8; IV Piggyback:195.9] Out: -  Intake/Output this shift: No intake/output data recorded.  Lungs: CTAB  Cardiovascular: RRR  Abd: soft, ATTP, incisions c/d/i  Extremities: no edema  Neuro: AOx4  Lab Results: CBC  Recent Labs    04/30/18 1054 05/01/18 0447  WBC 8.8 5.2  HGB 14.8 13.6  HCT 45.2 41.2  PLT 185 159   BMET Recent Labs    05/02/18 0542 05/03/18 0353  NA 142 142  K 4.1 4.0  CL 104 106  CO2 28 29  GLUCOSE 109* 145*  BUN 14 10  CREATININE 0.79 0.90  CALCIUM 8.9 8.5*   PT/INR Recent Labs    04/30/18 1054  LABPROT 14.1  INR 1.10   ABG No results for input(s): PHART, HCO3 in the last 72 hours.  Invalid input(s): PCO2, PO2  Studies/Results:  Anti-infectives: Anti-infectives (From admission, onward)   Start     Dose/Rate Route Frequency Ordered Stop   04/30/18 2100  piperacillin-tazobactam (ZOSYN) IVPB 3.375 g  Status:  Discontinued     3.375 g 12.5 mL/hr over 240 Minutes Intravenous Every 8 hours 04/30/18 1519 05/03/18 1021   04/30/18 0945  piperacillin-tazobactam (ZOSYN) IVPB 3.375 g  Status:  Discontinued     3.375 g 12.5  mL/hr over 240 Minutes Intravenous Every 8 hours 04/30/18 0935 04/30/18 1519      Medications: Scheduled Meds: . LORazepam  1 mg Intravenous Once   Continuous Infusions: . dextrose 5 % and 0.9% NaCl Stopped (05/03/18 0956)  . lactated ringers Stopped (05/02/18 1046)   PRN Meds:.acetaminophen **OR** acetaminophen, HYDROmorphone (DILAUDID) injection, ondansetron **OR** ondansetron (ZOFRAN) IV, traMADol  Cindy PickleLuke Aaron Harlow Basley, MD Pg# [phone] [facility]

## 2018-05-03 NOTE — Discharge Summary (Signed)
Physician Discharge Summary  Carylon Perchesenny B Zegers XBM:[id number] DOB: 1966/08/20 DOA: 04/30/2018  PCP: Ernestina PennaMoore, Donald W, MD  Admit date: 04/30/2018 Discharge date: 05/03/2018  Admitted From: Home Disposition:  Home  Recommendations for Outpatient Follow-up:  1. Follow up with PCP in 1-2 weeks 2. Follow up with General Surgery as scheduled  Discharge Condition:Improved CODE STATUS:Full Diet recommendation: Low fat  Brief/Interim Summary: 52 y.o.femalewithno significant pastmedical history presenting [facility]ed[facility]. Monday, she felt sick to her stomach like she ate something bad. She had some back pain but thought it was related to her chairat work. The back pain worsened overnight and is constant in the middle of her back. She called 911 [facility]; she had a cardiac evaluationwith negative troponin, negative EKG, and negative CTA chestand was discharged. She took 1 Phenergan but her nausea subsided and she didn't need anymore. She took 3 doses of Ibuprofen for pain. Tuesday, she just laid around. Wednesday, she felt a little better but then she got chills in the middle of the day. Last night, about 930pm, she noticed chest pain. She broke out into a sweat and again called 911. [facility] The doctor came in and told her that her liver and WBC didn't look good so he "did the gallbladder thing and he came in with the bad news." She is currently having ongoing severe back pain - she does not normally have back pain. Mild nausea. No vomiting.   ED Course:CT A/P concerning for acute cholecystitis and choledocholithiasis with mild CBD dilatation. Started on IVF and Zosyn.  Choledocholithiasis with acute cholecystitis -[facility] with probable choledocholithiasis -GI and general surgery  consulted -Pt is s/p ERCP with stone extraction on 8/16 -Pt now s/p cholecystectomy 8/17 -Had was continued on empiric zosyn -LFT's improved on follow up CMP, tolerating diet  Elevated BP -BP transiently elevated post-surger, likley secondary to stress reaction vs pain post op -Improved   Discharge Diagnoses:  Active Problems:   Choledocholithiasis with acute cholecystitis   Choledocholithiasis   Elevated LFTs    Discharge Instructions   Allergies as of 05/03/2018   No Known Allergies     Medication List    TAKE these medications   ibuprofen 400 MG tablet Commonly known as:  ADVIL,MOTRIN Take 400 mg by mouth every 6 (six) hours as needed for pain.   multivitamin with minerals Tabs tablet Take 1 tablet by mouth daily.   promethazine 25 MG tablet Commonly known as:  PHENERGAN Take 25 mg by mouth every 6 (six) hours as needed for nausea/vomiting.   traMADol 50 MG tablet Commonly known as:  ULTRAM Take 1 tablet (50 mg total) by mouth every 6 (six) hours as needed for moderate pain.      Follow-up Information    [facility] [place] Call in 2 week(s).   Specialty:  General Surgery Why:  Call to arranage follow up from your recent surgery Contact information: [address] [place] [id number] [phone]         No Known Allergies  Consultations:  GI  General Surgery  Procedures/Studies: Mr 3d Recon At Scanner  Result Date: 04/30/2018 CLINICAL DATA:  Right upper quadrant pain and nausea for 4 days. Acute cholecystitis and choledocholithiasis. EXAM: MRI ABDOMEN WITHOUT AND WITH CONTRAST (INCLUDING MRCP) TECHNIQUE: Multiplanar multisequence MR imaging of the abdomen was performed both before and  after the administration of intravenous contrast. Heavily T2-weighted images of the biliary and pancreatic ducts were obtained, and three-dimensional MRCP images were rendered by post processing. CONTRAST:  18mL  MULTIHANCE GADOBENATE DIMEGLUMINE 529 MG/ML IV SOLN COMPARISON:  None. FINDINGS: Lower Chest: Mild right basilar atelectasis. Hepatobiliary: No hepatic masses identified. A few tiny sub-cm cysts are noted in the left hepatic lobe. Tiny gallstones are seen. The gallbladder shows diffuse wall thickening with contrast enhancement and pericholecystic inflammatory changes, consistent with acute cholecystitis. Mild diffuse biliary ductal dilatation is seen, with common bile duct measuring 8 mm in diameter. At least 1 stone is seen in the distal common bile duct measuring 7 mm. There is also smooth narrowing of the distal common bile duct just above the ampulla which may be due to a distal common bile duct stricture. Pancreas: No mass or inflammatory changes. No evidence of pancreatic ductal dilatation or pancreas divisum Spleen: Within normal limits in size and appearance. Adrenals/Urinary Tract: No masses identified. Small simple right renal cyst noted. No evidence of hydronephrosis. Stomach/Bowel: No evidence of obstruction, inflammatory process or abnormal fluid collections. Vascular/Lymphatic: No pathologically enlarged lymph nodes. No abdominal aortic aneurysm. Reproductive:  No mass or other significant abnormality. Other:  None. Musculoskeletal:  No suspicious bone lesions identified. IMPRESSION: Acute cholecystitis. Mild biliary ductal dilatation with 7 mm stone in the distal common bile duct, and possible distal common bile duct stricture. No evidence of pancreatic mass or pancreatic ductal dilatation. Electronically Signed   By: Myles Rosenthal M.D.   On: 04/30/2018 20:07   Dg Ercp Biliary & Pancreatic Ducts  Result Date: 05/01/2018 CLINICAL DATA:  Bile duct stone suspected on MRCP EXAM: ERCP TECHNIQUE: Multiple spot images obtained with the fluoroscopic device and submitted for interpretation post-procedure. COMPARISON:  MRCP 04/30/2018 FINDINGS: A series of fluoroscopic spot images document endoscopic  cannulation and opacification of the CBD. Mobile filling defect in the distal CBD. Incomplete opacification of the intrahepatic biliary tree, appearing decompressed centrally. Subsequent images document passage of a balloon tipped catheter through the CBD. IMPRESSION: Endoscopic CBD cannulation and intervention. Intraluminal filling defect suggesting choledocholithiasis. These images were submitted for radiologic interpretation only. Please see the procedural report for the amount of contrast and the fluoroscopy time utilized. Electronically Signed   By: Corlis Leak M.D.   On: 05/01/2018 15:20   Mr Abdomen Mrcp Vivien Rossetti Contast  Result Date: 04/30/2018 CLINICAL DATA:  Right upper quadrant pain and nausea for 4 days. Acute cholecystitis and choledocholithiasis. EXAM: MRI ABDOMEN WITHOUT AND WITH CONTRAST (INCLUDING MRCP) TECHNIQUE: Multiplanar multisequence MR imaging of the abdomen was performed both before and after the administration of intravenous contrast. Heavily T2-weighted images of the biliary and pancreatic ducts were obtained, and three-dimensional MRCP images were rendered by post processing. CONTRAST:  18mL MULTIHANCE GADOBENATE DIMEGLUMINE 529 MG/ML IV SOLN COMPARISON:  None. FINDINGS: Lower Chest: Mild right basilar atelectasis. Hepatobiliary: No hepatic masses identified. A few tiny sub-cm cysts are noted in the left hepatic lobe. Tiny gallstones are seen. The gallbladder shows diffuse wall thickening with contrast enhancement and pericholecystic inflammatory changes, consistent with acute cholecystitis. Mild diffuse biliary ductal dilatation is seen, with common bile duct measuring 8 mm in diameter. At least 1 stone is seen in the distal common bile duct measuring 7 mm. There is also smooth narrowing of the distal common bile duct just above the ampulla which may be due to a distal common bile duct stricture. Pancreas: No mass or inflammatory changes. No evidence of  pancreatic ductal dilatation or  pancreas divisum Spleen: Within normal limits in size and appearance. Adrenals/Urinary Tract: No masses identified. Small simple right renal cyst noted. No evidence of hydronephrosis. Stomach/Bowel: No evidence of obstruction, inflammatory process or abnormal fluid collections. Vascular/Lymphatic: No pathologically enlarged lymph nodes. No abdominal aortic aneurysm. Reproductive:  No mass or other significant abnormality. Other:  None. Musculoskeletal:  No suspicious bone lesions identified. IMPRESSION: Acute cholecystitis. Mild biliary ductal dilatation with 7 mm stone in the distal common bile duct, and possible distal common bile duct stricture. No evidence of pancreatic mass or pancreatic ductal dilatation. Electronically Signed   By: Myles RosenthalJohn  Stahl M.D.   On: 04/30/2018 20:07    Subjective: Eager to go home  Discharge Exam: Vitals:   05/03/18 0916 05/03/18 1207  BP: 113/80 124/82  Pulse: (!) 59 64  Resp: 18 18  Temp: 97.9 F (36.6 C) 98.2 F (36.8 C)  SpO2: 94% 94%   Vitals:   05/03/18 0002 05/03/18 0358 05/03/18 0916 05/03/18 1207  BP: 121/69 125/75 113/80 124/82  Pulse: 62 (!) 50 (!) 59 64  Resp: 18 18 18 18   Temp: 97.8 F (36.6 C) 97.7 F (36.5 C) 97.9 F (36.6 C) 98.2 F (36.8 C)  TempSrc: Oral Oral Oral Oral  SpO2: 96% 93% 94% 94%  Weight:      Height:        General: Pt is alert, awake, not in acute distress Cardiovascular: RRR, S1/S2 +, no rubs, no gallops Respiratory: CTA bilaterally, no wheezing, no rhonchi Abdominal: Soft, NT, ND, bowel sounds + Extremities: no edema, no cyanosis   The results of significant diagnostics from this hospitalization (including imaging, microbiology, ancillary and laboratory) are listed below for reference.     Microbiology: No results found for this or any previous visit (from the past 240 hour(s)).   Labs: BNP (last 3 results) No results for input(s): BNP in the last 8760 hours. Basic Metabolic Panel: Recent Labs  Lab  04/30/18 1054 05/01/18 0447 05/02/18 0542 05/03/18 0353  NA 142 143 142 142  K 3.6 3.5 4.1 4.0  CL 105 107 104 106  CO2 28 28 28 29   GLUCOSE 116* 90 109* 145*  BUN 13 15 14 10   CREATININE 0.88 0.88 0.79 0.90  CALCIUM 9.2 8.9 8.9 8.5*   Liver Function Tests: Recent Labs  Lab 04/30/18 1054 05/01/18 0447 05/02/18 0542 05/03/18 0353  AST 125* 67* 61* 35  ALT 132* 115* 109* 85*  ALKPHOS 145* 150* 165* 126  BILITOT 3.6* 2.1* 1.6* 1.0  PROT 6.9 6.1* 6.0* 6.0*  ALBUMIN 3.3* 2.8* 2.7* 2.8*   Recent Labs  Lab 05/01/18 1245  LIPASE 21   No results for input(s): AMMONIA in the last 168 hours. CBC: Recent Labs  Lab 04/30/18 1054 05/01/18 0447  WBC 8.8 5.2  NEUTROABS 7.5  --   HGB 14.8 13.6  HCT 45.2 41.2  MCV 90.6 90.9  PLT 185 159   Cardiac Enzymes: No results for input(s): CKTOTAL, CKMB, CKMBINDEX, TROPONINI in the last 168 hours. BNP: Invalid input(s): POCBNP CBG: No results for input(s): GLUCAP in the last 168 hours. D-Dimer No results for input(s): DDIMER in the last 72 hours. Hgb A1c No results for input(s): HGBA1C in the last 72 hours. Lipid Profile No results for input(s): CHOL, HDL, LDLCALC, TRIG, CHOLHDL, LDLDIRECT in the last 72 hours. Thyroid function studies No results for input(s): TSH, T4TOTAL, T3FREE, THYROIDAB in the last 72 hours.  Invalid input(s): FREET3 Anemia  work up No results for input(s): VITAMINB12, FOLATE, FERRITIN, TIBC, IRON, RETICCTPCT in the last 72 hours. Urinalysis No results found for: COLORURINE, APPEARANCEUR, LABSPEC, PHURINE, GLUCOSEU, HGBUR, BILIRUBINUR, KETONESUR, PROTEINUR, UROBILINOGEN, NITRITE, LEUKOCYTESUR Sepsis Labs Invalid input(s): PROCALCITONIN,  WBC,  LACTICIDVEN Microbiology No results found for this or any previous visit (from the past 240 hour(s)).  Time spent:  SIGNED:   Rickey Barbara, MD  [facility] 05/03/2018, 1:22 PM  If 7PM-7AM, please contact night-coverage [email or web address] Password  [email or web address]

## 2018-05-03 NOTE — Discharge Instructions (Signed)

## 2019-06-25 ENCOUNTER — Other Ambulatory Visit: Payer: Self-pay

## 2019-06-25 DIAGNOSIS — Z20822 Contact with and (suspected) exposure to covid-19: Secondary | ICD-10-CM

## 2019-06-26 LAB — NOVEL CORONAVIRUS, NAA: SARS-CoV-2, NAA: NOT DETECTED

## 2019-09-30 IMAGING — RF DG ERCP WO/W SPHINCTEROTOMY
1 series · 8 of 8 positions shown · non-contrast
Comparison: MRCP 04/30/2018

CLINICAL DATA: Bile duct stone suspected on MRCP

EXAM:
ERCP
TECHNIQUE: Multiple spot images obtained with the fluoroscopic device and
submitted for interpretation post-procedure.

[Series 1: run · 8 of 8 slices shown]
[im 1/8]
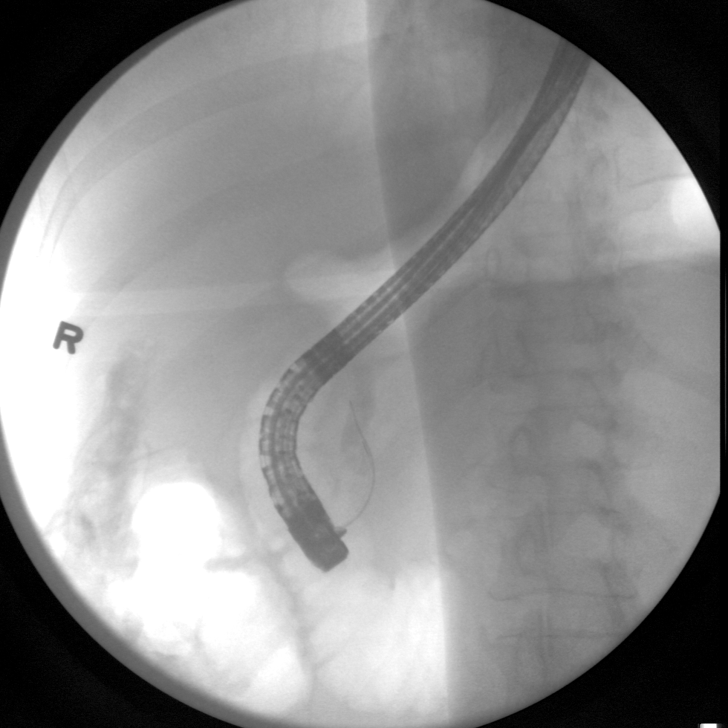
[im 2/8]
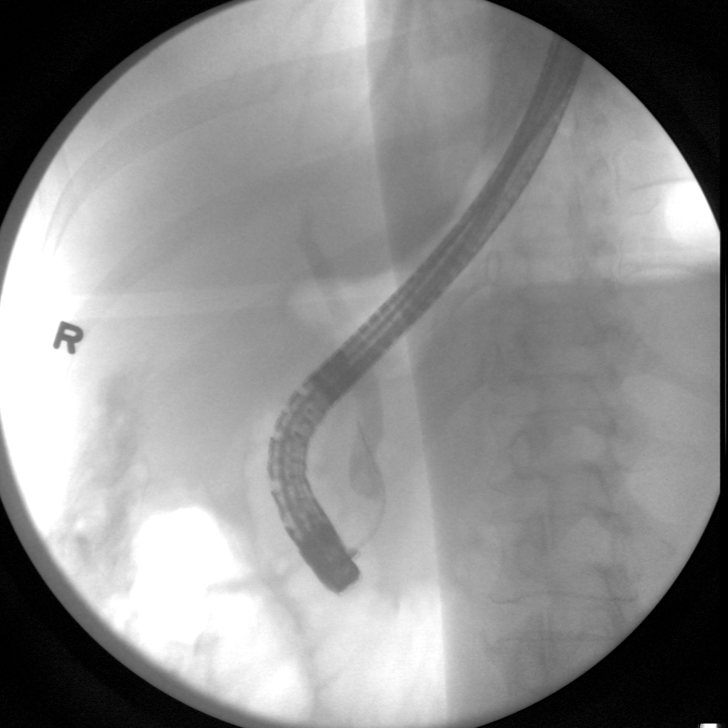
[im 3/8]
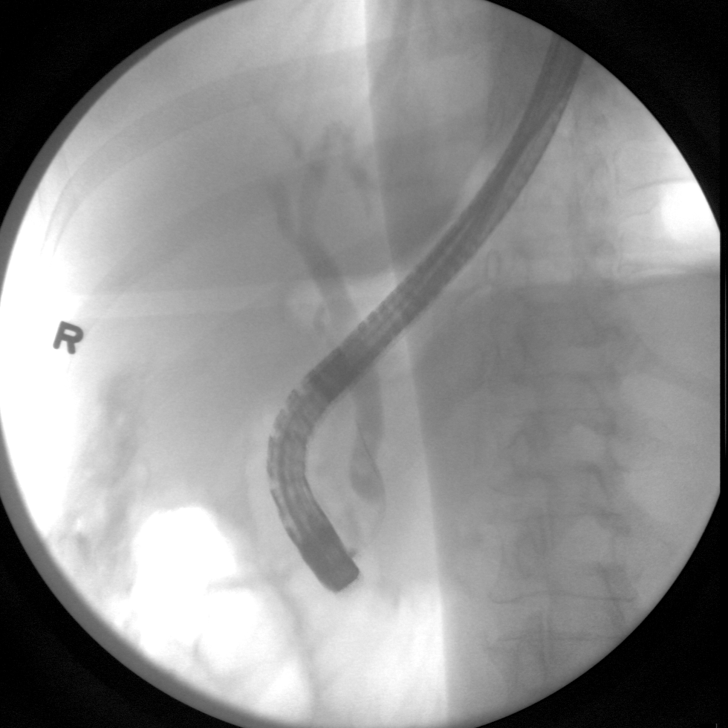
[im 4/8]
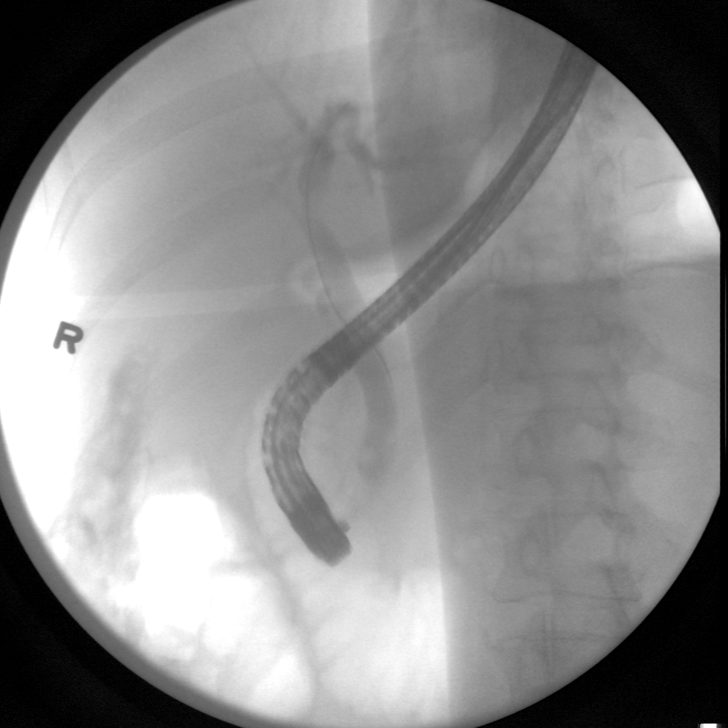
[im 5/8]
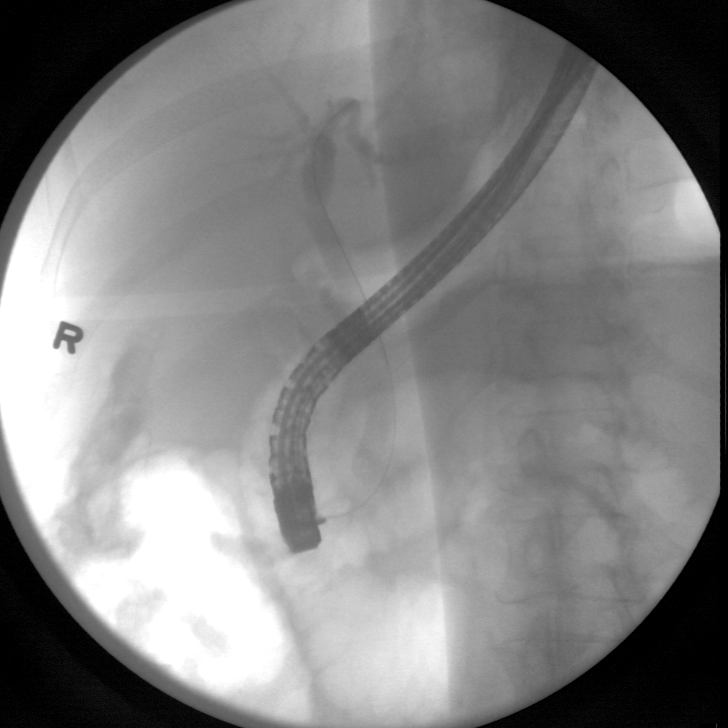
[im 6/8]
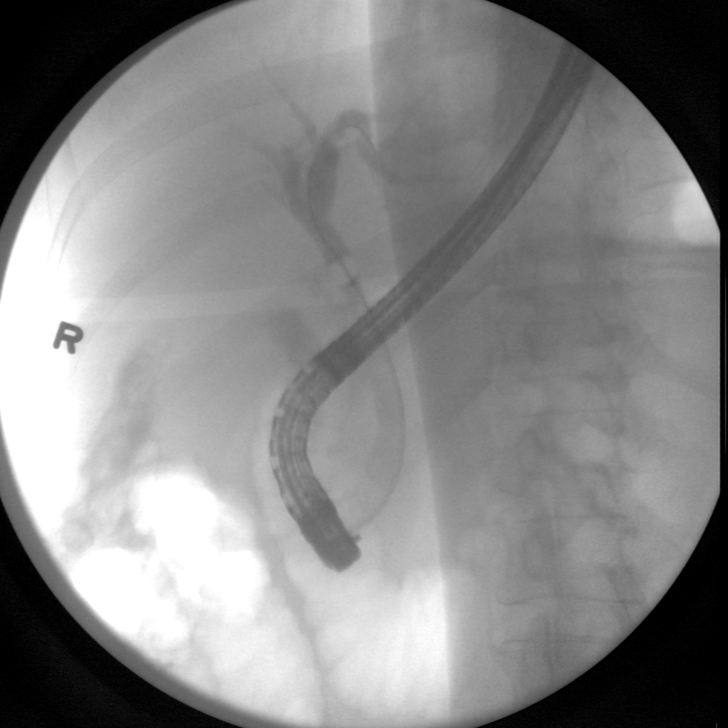
[im 7/8]
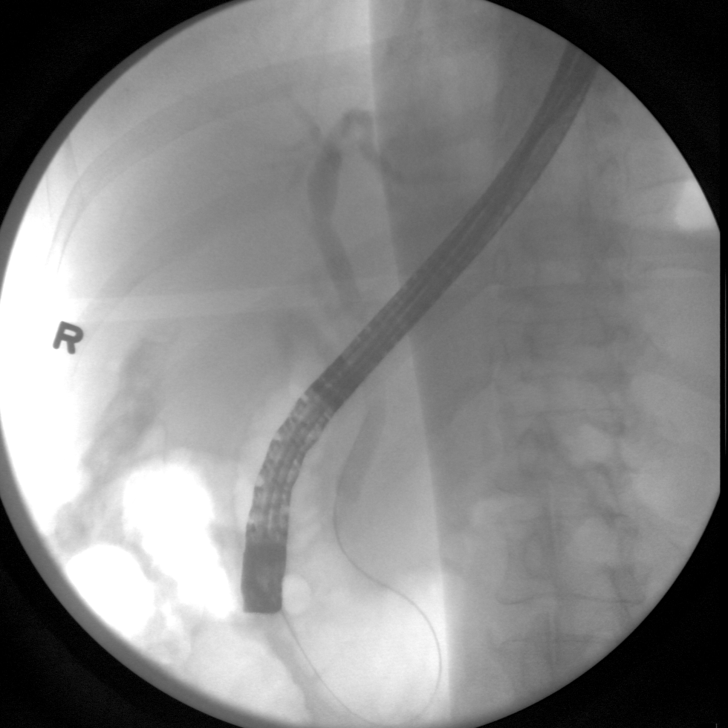
[im 8/8]
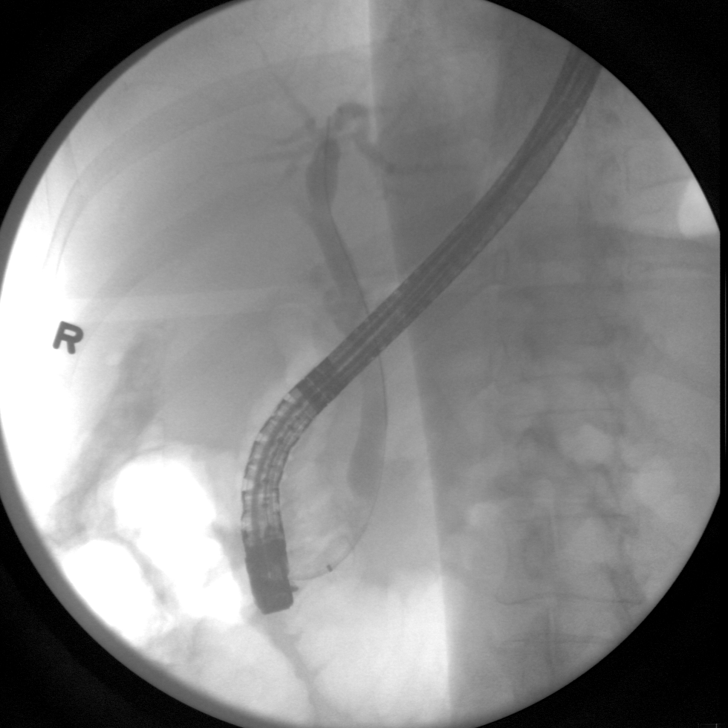

[8 of 8 positions shown; findings below may reference images not displayed]

FINDINGS: A series of fluoroscopic spot images document endoscopic cannulation
and opacification of the CBD. Mobile filling defect in the distal
CBD. Incomplete opacification of the intrahepatic biliary tree,
appearing decompressed centrally. Subsequent images document passage
of a balloon tipped catheter through the CBD.
IMPRESSION: Endoscopic CBD cannulation and intervention. Intraluminal filling
defect suggesting choledocholithiasis.

These images were submitted for radiologic interpretation only.
Please see the procedural report for the amount of contrast and the
fluoroscopy time utilized.

## 2021-02-02 ENCOUNTER — Other Ambulatory Visit: Payer: Self-pay

## 2021-02-02 ENCOUNTER — Encounter: Payer: Self-pay | Admitting: Family Medicine

## 2021-02-02 ENCOUNTER — Ambulatory Visit: Payer: BC Managed Care – PPO | Admitting: Family Medicine

## 2021-02-02 VITALS — BP 179/99 | HR 77 | Temp 98.8°F | Ht 62.0 in | Wt 205.2 lb

## 2021-02-02 DIAGNOSIS — Z6837 Body mass index (BMI) 37.0-37.9, adult: Secondary | ICD-10-CM

## 2021-02-02 DIAGNOSIS — I10 Essential (primary) hypertension: Secondary | ICD-10-CM | POA: Diagnosis not present

## 2021-02-02 MED ORDER — HYDROCHLOROTHIAZIDE 25 MG PO TABS: 25.0000 mg | ORAL_TABLET | Freq: Every day | ORAL | 1 refills | Status: DC

## 2021-02-02 NOTE — Progress Notes (Signed)
New Patient Office Visit  Subjective:  Patient ID: Cindy Hall, female    DOB: [birth year]  Age: 55 y.o. MRN: [id number]  CC:  Chief Complaint  Patient presents with  . New Patient (Initial Visit)    HPI Cindy Hall presents to establish care. 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 She is not medication for this. Denies chest pain, shortness of breath, edema, palpitations, headaches, or vision changes.   Past Medical History:  Diagnosis Date  . Endometriosis determined by laparoscopy   . Family history of adverse reaction to anesthesia    " my father had to be put on a ventilator after surgery "  . Gallstones 04/30/2018    Past Surgical History:  Procedure Laterality Date  . APPENDECTOMY     ruptured appy  . CHOLECYSTECTOMY N/A 05/02/2018   Procedure: LAPAROSCOPIC CHOLECYSTECTOMY WITH POSSIBLE INTRAOPERATIVE CHOLANGIOGRAM;  Surgeon: Ralene Ok, MD;  Location: [facility];  Service: General;  Laterality: N/A;  . ENDOSCOPIC RETROGRADE CHOLANGIOPANCREATOGRAPHY (ERCP) WITH PROPOFOL N/A 05/01/2018   Procedure: ENDOSCOPIC RETROGRADE CHOLANGIOPANCREATOGRAPHY (ERCP) WITH PROPOFOL;  Surgeon: Ladene Artist, MD;  Location: [facility];  Service: Endoscopy;  Laterality: N/A;  . REMOVAL OF STONES  05/01/2018   Procedure: REMOVAL OF STONES;  Surgeon: Ladene Artist, MD;  Location: [facility] END[facility]COPY;  Service: Endoscopy;;  . RIGHT OOPHORECTOMY    . SPHINCTEROTOMY  05/01/2018   Procedure: SPHINCTEROTOMY;  Surgeon: Ladene Artist, MD;  Location: [facility] ENDOSCOPY;  Service: Endoscopy;;  . UMBILICAL HERNIA REPAIR      Family History  Problem Relation Age of Onset  . Colon cancer Mother 68  . Cancer Father        prostate  . Hypertension Brother   . Gallbladder disease Neg Hx     Social History   Socioeconomic History  . Marital status: Divorced    Spouse name: Not on file  . Number of children: 2  . Years of education: 82  . Highest education level: High  school graduate  Occupational History  . Occupation: Control and instrumentation engineer, [employer]  Tobacco Use  . Smoking status: Never Smoker  . Smokeless tobacco: Never Used  Vaping Use  . Vaping Use: Never used  Substance and Sexual Activity  . Alcohol use: Never  . Drug use: Never  . Sexual activity: Yes    Birth control/protection: Surgical  Other Topics Concern  . Not on file  Social History Narrative  . Not on file   Social Determinants of Health   Financial Resource Strain: Not on file  Food Insecurity: Not on file  Transportation Needs: Not on file  Physical Activity: Not on file  Stress: Not on file  Social Connections: Not on file  Intimate Partner Violence: Not on file    ROS Review of Systems As per HPI.   Objective:   Today's Vitals: BP (!) 179/99   Pulse 77   Temp 98.8 F (37.1 C) (Temporal)   Ht _0  (1.575 m)   Wt 205 lb 4 oz (93.1 kg)   BMI 37.54 kg/m   Physical Exam Vitals and nursing note reviewed.  Constitutional:      General: She is not in acute distress.    Appearance: She is not ill-appearing, toxic-appearing or diaphoretic.  Cardiovascular:     Rate and Rhythm: Normal rate and regular rhythm.     Heart sounds: Normal heart sounds. No murmur heard.   Pulmonary:  Effort: Pulmonary effort is normal. No respiratory distress.     Breath sounds: Normal breath sounds.  Abdominal:     General: Bowel sounds are normal. There is no distension.     Palpations: Abdomen is soft.     Tenderness: There is no abdominal tenderness. There is no guarding or rebound.  Musculoskeletal:     Right lower leg: No edema.     Left lower leg: No edema.  Skin:    General: Skin is warm and dry.  Neurological:     General: No focal deficit present.     Mental Status: She is alert and oriented to person, place, and time.  Psychiatric:        Mood and Affect: Mood normal.        Behavior: Behavior normal.     Assessment & Plan:   Cindy Hall was  seen today for new patient (initial visit).  Diagnoses and all orders for this visit:  Primary hypertension Uncontrolled. Start HCTZ as below. Labs pending. Discussed diet and exercise.  -     CBC with Differential/Platelet; Future -     CMP14+EGFR; Future -     Lipid panel; Future -     TSH; Future -     hydrochlorothiazide (HYDRODIURIL) 25 MG tablet; Take 1 tablet (25 mg total) by mouth daily.  BMI 37.0-37.9, adult Diet and exercise.  -     CBC with Differential/Platelet; Future -     CMP14+EGFR; Future -     Lipid panel; Future -     TSH; Future   Follow-up: Return in about 2 weeks (around 02/16/2021) for BP follow up.   The patient indicates understanding of these issues and agrees with the plan.  Gwenlyn Perking, FNP

## 2021-02-02 NOTE — Patient Instructions (Signed)

## 2021-02-19 ENCOUNTER — Encounter: Payer: Self-pay | Admitting: Family Medicine

## 2021-02-19 ENCOUNTER — Other Ambulatory Visit: Payer: Self-pay

## 2021-02-19 ENCOUNTER — Ambulatory Visit: Payer: BC Managed Care – PPO | Admitting: Family Medicine

## 2021-02-19 VITALS — BP 154/93 | HR 82 | Temp 98.2°F | Ht 62.0 in | Wt 204.4 lb

## 2021-02-19 DIAGNOSIS — I10 Essential (primary) hypertension: Secondary | ICD-10-CM | POA: Diagnosis not present

## 2021-02-19 MED ORDER — LISINOPRIL 10 MG PO TABS: 10.0000 mg | ORAL_TABLET | Freq: Every day | ORAL | 3 refills | Status: DC

## 2021-02-19 NOTE — Progress Notes (Signed)
Established Patient Office Visit  Subjective:  Patient ID: Cindy Hall, female    DOB: Sep 20, 1965  Age: 55 y.o. MRN: [id number]  CC:  Chief Complaint  Patient presents with  . Hypertension    HPI [name] presents for BP follow up. She has been taking HCTZ since her last visit. She reports tolerating this well without side effects. She forgot to have labs done. She plans to come by on Friday morning for fasting labs. Denies chest pain, shortness of breath, edema, palpitations, dizziness, or focal weakness.   Past Medical History:  Diagnosis Date  . Endometriosis determined by laparoscopy   . Family history of adverse reaction to anesthesia    " my father had to be put on a ventilator after surgery "  . Gallstones 04/30/2018    Past Surgical History:  Procedure Laterality Date  . APPENDECTOMY     ruptured appy  . CHOLECYSTECTOMY N/A 05/02/2018   Procedure: LAPAROSCOPIC CHOLECYSTECTOMY WITH POSSIBLE INTRAOPERATIVE CHOLANGIOGRAM;  Surgeon: Axel Filler, MD;  Location: [facility];  Service: General;  Laterality: N/A;  . ENDOSCOPIC RETROGRADE CHOLANGIOPANCREATOGRAPHY (ERCP) WITH PROPOFOL N/A 05/01/2018   Procedure: ENDOSCOPIC RETROGRADE CHOLANGIOPANCREATOGRAPHY (ERCP) WITH PROPOFOL;  Surgeon: Meryl Dare, MD;  Location: [facility] ENDOSCOPY;  Service: Endoscopy;  Laterality: N/A;  . REMOVAL OF STONES  05/01/2018   Procedure: REMOVAL OF STONES;  Surgeon: Meryl Dare, MD;  Location: [facility] ENDOSCOPY;  Service: Endoscopy;;  . RIGHT OOPHORECTOMY    . SPHINCTEROTOMY  05/01/2018   Procedure: SPHINCTEROTOMY;  Surgeon: Meryl Dare, MD;  Location: [facility] ENDOSCOPY;  Service: Endoscopy;;  . UMBILICAL HERNIA REPAIR      Family History  Problem Relation Age of Onset  . Colon cancer Mother 8  . Cancer Father        prostate  . Hypertension Brother   . Gallbladder disease Neg Hx     Social History   Socioeconomic History  . Marital status: Divorced    Spouse name: Not on file   . Number of children: 2  . Years of education: 35  . Highest education level: High school graduate  Occupational History  . Occupation: Geologist, engineering, [employer]  Tobacco Use  . Smoking status: Never Smoker  . Smokeless tobacco: Never Used  Vaping Use  . Vaping Use: Never used  Substance and Sexual Activity  . Alcohol use: Never  . Drug use: Never  . Sexual activity: Yes    Birth control/protection: Surgical  Other Topics Concern  . Not on file  Social History Narrative  . Not on file   Social Determinants of Health   Financial Resource Strain: Not on file  Food Insecurity: Not on file  Transportation Needs: Not on file  Physical Activity: Not on file  Stress: Not on file  Social Connections: Not on file  Intimate Partner Violence: Not on file    Outpatient Medications Prior to Visit  Medication Sig Dispense Refill  . hydrochlorothiazide (HYDRODIURIL) 25 MG tablet Take 1 tablet (25 mg total) by mouth daily. 30 tablet 1  . ibuprofen (ADVIL,MOTRIN) 400 MG tablet Take 400 mg by mouth every 6 (six) hours as needed for pain.    . Multiple Vitamin (MULTIVITAMIN WITH MINERALS) TABS tablet Take 1 tablet by mouth daily.     No facility-administered medications prior to visit.    No Known Allergies  ROS Review of Systems As per HPI.   Objective:    Physical Exam Vitals and nursing  note reviewed.  Constitutional:      General: She is not in acute distress.    Appearance: She is not ill-appearing, toxic-appearing or diaphoretic.  Cardiovascular:     Rate and Rhythm: Normal rate and regular rhythm.     Heart sounds: Normal heart sounds. No murmur heard.   Pulmonary:     Effort: Pulmonary effort is normal. No respiratory distress.     Breath sounds: Normal breath sounds. No wheezing or rales.  Musculoskeletal:     Right lower leg: No edema.     Left lower leg: No edema.  Skin:    General: Skin is warm and dry.  Neurological:     General: No  focal deficit present.     Mental Status: She is alert and oriented to person, place, and time.  Psychiatric:        Mood and Affect: Mood normal.        Behavior: Behavior normal.     BP (!) 154/93   Pulse 82   Temp 98.2 F (36.8 C) (Oral)   Ht 5\' 2"  (1.575 m)   Wt 204 lb 6 oz (92.7 kg)   BMI 37.38 kg/m  Wt Readings from Last 3 Encounters:  02/19/21 204 lb 6 oz (92.7 kg)  02/02/21 205 lb 4 oz (93.1 kg)  05/01/18 209 lb 10.5 oz (95.1 kg)     There are no preventive care reminders to display for this patient.  There are no preventive care reminders to display for this patient.  No results found for: TSH Lab Results  Component Value Date   WBC 5.2 05/01/2018   HGB 13.6 05/01/2018   HCT 41.2 05/01/2018   MCV 90.9 05/01/2018   PLT 159 05/01/2018   Lab Results  Component Value Date   NA 142 05/03/2018   K 4.0 05/03/2018   CO2 29 05/03/2018   GLUCOSE 145 (H) 05/03/2018   BUN 10 05/03/2018   CREATININE 0.90 05/03/2018   BILITOT 1.0 05/03/2018   ALKPHOS 126 05/03/2018   AST 35 05/03/2018   ALT 85 (H) 05/03/2018   PROT 6.0 (L) 05/03/2018   ALBUMIN 2.8 (L) 05/03/2018   CALCIUM 8.5 (L) 05/03/2018   ANIONGAP 7 05/03/2018   No results found for: CHOL No results found for: HDL No results found for: LDLCALC No results found for: TRIG No results found for: CHOLHDL No results found for: 05/05/2018    Assessment & Plan:   Suesan was seen today for hypertension.  Diagnoses and all orders for this visit:  Primary hypertension Uncontrolled. Continue HCTZ. Add lisinopril as below. Follow up in 2 weeks. Discussed combo pill if BP is controlled at next visit and she is tolerating medication. She will return for fasting lab work.  -     lisinopril (ZESTRIL) 10 MG tablet; Take 1 tablet (10 mg total) by mouth daily.  Follow-up: Return in about 2 weeks (around 03/05/2021) for HTN.   The patient indicates understanding of these issues and agrees with the plan.  03/07/2021, FNP

## 2021-03-02 ENCOUNTER — Ambulatory Visit: Payer: BC Managed Care – PPO | Admitting: Family Medicine

## 2021-03-15 ENCOUNTER — Ambulatory Visit: Payer: BC Managed Care – PPO | Admitting: Family Medicine

## 2021-03-21 ENCOUNTER — Ambulatory Visit: Payer: BC Managed Care – PPO | Admitting: Family Medicine

## 2021-03-28 ENCOUNTER — Ambulatory Visit: Payer: BC Managed Care – PPO | Admitting: Family Medicine

## 2021-03-29 ENCOUNTER — Other Ambulatory Visit: Payer: Self-pay | Admitting: Family Medicine

## 2021-03-29 DIAGNOSIS — I10 Essential (primary) hypertension: Secondary | ICD-10-CM

## 2021-03-30 ENCOUNTER — Ambulatory Visit: Payer: BC Managed Care – PPO | Admitting: Family Medicine

## 2021-03-30 ENCOUNTER — Other Ambulatory Visit: Payer: Self-pay

## 2021-03-30 ENCOUNTER — Encounter: Payer: Self-pay | Admitting: Family Medicine

## 2021-03-30 VITALS — BP 129/92 | HR 79 | Temp 98.1°F | Ht 62.0 in | Wt 202.2 lb

## 2021-03-30 DIAGNOSIS — I10 Essential (primary) hypertension: Secondary | ICD-10-CM

## 2021-03-30 DIAGNOSIS — Z6836 Body mass index (BMI) 36.0-36.9, adult: Secondary | ICD-10-CM | POA: Diagnosis not present

## 2021-03-30 MED ORDER — HYDROCHLOROTHIAZIDE 25 MG PO TABS: 25.0000 mg | ORAL_TABLET | Freq: Every day | ORAL | 3 refills | Status: DC

## 2021-03-30 NOTE — Progress Notes (Signed)
Established Patient Office Visit  Subjective:  Patient ID: Cindy Hall, female    DOB: 07-Jun-1966  Age: 55 y.o. MRN: [id number]  CC:  Chief Complaint  Patient presents with   Hypertension    HPI Cindy Hall presents for HTN follow up.  HTN Complaint with meds - Yes Current Medications - lisinopril 10, HCTZ 25 Pertinent ROS:  Headache - No Fatigue - No Visual Disturbances - No Chest pain - No Dyspnea - No Palpitations - No LE edema - No They report good compliance with medications and can restate their regimen by memory. No medication side effects.  Family, social, and smoking history reviewed.   BP Readings from Last 3 Encounters:  03/30/21 (!) 129/92  02/19/21 (!) 154/93  02/02/21 (!) 179/99   CMP Latest Ref Rng & Units 03/30/2021 05/03/2018 05/02/2018  Glucose 65 - 99 mg/dL 100(H) 145(H) 109(H)  BUN 6 - 24 mg/dL _0 Creatinine 0.57 - 1.00 mg/dL 0.83 0.90 0.79  Sodium 134 - 144 mmol/L 142 142 142  Potassium 3.5 - 5.2 mmol/L 3.5 4.0 4.1  Chloride 96 - 106 mmol/L 102 106 104  CO2 20 - 29 mmol/L _1 Calcium 8.7 - 10.2 mg/dL 9.6 8.5(L) 8.9  Total Protein 6.0 - 8.5 g/dL 6.6 6.0(L) 6.0(L)  Total Bilirubin 0.0 - 1.2 mg/dL 0.6 1.0 1.6(H)  Alkaline Phos 44 - 121 IU/L 107 126 165(H)  AST 0 - 40 IU/L 16 35 61(H)  ALT 0 - 32 IU/L 14 85(H) 109(H)      Past Medical History:  Diagnosis Date   Endometriosis determined by laparoscopy    Family history of adverse reaction to anesthesia    " my father had to be put on a ventilator after surgery "   Gallstones 04/30/2018    Past Surgical History:  Procedure Laterality Date   APPENDECTOMY     ruptured appy   CHOLECYSTECTOMY N/A 05/02/2018   Procedure: LAPAROSCOPIC CHOLECYSTECTOMY WITH POSSIBLE INTRAOPERATIVE CHOLANGIOGRAM;  Surgeon: Ralene Ok, MD;  Location: [place];  Service: General;  Laterality: N/A;   ENDOSCOPIC RETROGRADE CHOLANGIOPANCREATOGRAPHY (ERCP) WITH PROPOFOL N/A 05/01/2018   Procedure:  ENDOSCOPIC RETROGRADE CHOLANGIOPANCREATOGRAPHY (ERCP) WITH PROPOFOL;  Surgeon: Ladene Artist, MD;  Location: [facility] ENDOSCOPY;  Service: Endoscopy;  Laterality: N/A;   REMOVAL OF STONES  05/01/2018   Procedure: REMOVAL OF STONES;  Surgeon: Ladene Artist, MD;  Location: [facility] ENDOSCOPY;  Service: Endoscopy;;   RIGHT OOPHORECTOMY     SPHINCTEROTOMY  05/01/2018   Procedure: Joan Mayans;  Surgeon: Ladene Artist, MD;  Location: [facility] ENDOSCOPY;  Service: Endoscopy;;   UMBILICAL HERNIA REPAIR      Family History  Problem Relation Age of Onset   Colon cancer Mother 80   Cancer Father        prostate   Hypertension Brother    Gallbladder disease Neg Hx     Social History   Socioeconomic History   Marital status: Divorced    Spouse name: Not on file   Number of children: 2   Years of education: 12   Highest education level: High school graduate  Occupational History   Occupation: Control and instrumentation engineer, [employer]  Tobacco Use   Smoking status: Never   Smokeless tobacco: Never  Vaping Use   Vaping Use: Never used  Substance and Sexual Activity   Alcohol use: Never   Drug use: Never   Sexual activity: Yes    Birth control/protection: Surgical  Other Topics Concern   Not on file  Social History Narrative   Not on file   Social Determinants of Health   Financial Resource Strain: Not on file  Food Insecurity: Not on file  Transportation Needs: Not on file  Physical Activity: Not on file  Stress: Not on file  Social Connections: Not on file  Intimate Partner Violence: Not on file    Outpatient Medications Prior to Visit  Medication Sig Dispense Refill   hydrochlorothiazide (HYDRODIURIL) 25 MG tablet TAKE 1 TABLET (25 MG TOTAL) BY MOUTH DAILY. 30 tablet 0   ibuprofen (ADVIL,MOTRIN) 400 MG tablet Take 400 mg by mouth every 6 (six) hours as needed for pain.     lisinopril (ZESTRIL) 10 MG tablet Take 1 tablet (10 mg total) by mouth daily. 90 tablet 3   Multiple  Vitamin (MULTIVITAMIN WITH MINERALS) TABS tablet Take 1 tablet by mouth daily.     No facility-administered medications prior to visit.    No Known Allergies  ROS Review of Systems As per HPI.    Objective:    Physical Exam Vitals and nursing note reviewed.  Constitutional:      General: She is not in acute distress.    Appearance: She is not ill-appearing, toxic-appearing or diaphoretic.  Cardiovascular:     Rate and Rhythm: Normal rate and regular rhythm.     Heart sounds: Normal heart sounds. No murmur heard. Pulmonary:     Effort: Pulmonary effort is normal.     Breath sounds: Normal breath sounds.  Musculoskeletal:     Right lower leg: No edema.     Left lower leg: No edema.  Skin:    General: Skin is warm and dry.  Neurological:     General: No focal deficit present.     Mental Status: She is alert and oriented to person, place, and time.  Psychiatric:        Mood and Affect: Mood normal.        Behavior: Behavior normal.    BP (!) 129/92   Pulse 79   Temp 98.1 F (36.7 C)   Ht _0  (1.575 m)   Wt 202 lb 3.2 oz (91.7 kg)   SpO2 96%   BMI 36.98 kg/m  Wt Readings from Last 3 Encounters:  03/30/21 202 lb 3.2 oz (91.7 kg)  02/19/21 204 lb 6 oz (92.7 kg)  02/02/21 205 lb 4 oz (93.1 kg)     There are no preventive care reminders to display for this patient.  There are no preventive care reminders to display for this patient.  No results found for: TSH Lab Results  Component Value Date   WBC 5.2 05/01/2018   HGB 13.6 05/01/2018   HCT 41.2 05/01/2018   MCV 90.9 05/01/2018   PLT 159 05/01/2018   Lab Results  Component Value Date   NA 142 05/03/2018   K 4.0 05/03/2018   CO2 29 05/03/2018   GLUCOSE 145 (H) 05/03/2018   BUN 10 05/03/2018   CREATININE 0.90 05/03/2018   BILITOT 1.0 05/03/2018   ALKPHOS 126 05/03/2018   AST 35 05/03/2018   ALT 85 (H) 05/03/2018   PROT 6.0 (L) 05/03/2018   ALBUMIN 2.8 (L) 05/03/2018   CALCIUM 8.5 (L) 05/03/2018    ANIONGAP 7 05/03/2018   No results found for: CHOL No results found for: HDL No results found for: LDLCALC No results found for: TRIG No results found for: CHOLHDL No results found for: HGBA1C  Assessment & Plan:   Valisha was seen today for hypertension.  Diagnoses and all orders for this visit:  Primary hypertension Well controlled on current regimen. Labs pending.  -     hydrochlorothiazide (HYDRODIURIL) 25 MG tablet; Take 1 tablet (25 mg total) by mouth daily. -     CBC with Differential/Platelet -     CMP14+EGFR -     Lipid panel -     TSH  Class 2 severe obesity due to excess calories with serious comorbidity and body mass index (BMI) of 36.0 to 36.9 in adult [facility]) Lost 2 lbs since last visit. Diet and exercise. Labs pending.  -     CBC with Differential/Platelet -     CMP14+EGFR -     Lipid panel -     TSH    Follow-up: Return in about 3 months (around 06/30/2021) for chronic follow up.   The patient indicates understanding of these issues and agrees with the plan.  Gwenlyn Perking, FNP

## 2021-03-30 NOTE — Patient Instructions (Signed)

## 2021-03-31 LAB — CBC WITH DIFFERENTIAL/PLATELET
Basophils Absolute: 0 10*3/uL (ref 0.0–0.2)
Basos: 1 %
EOS (ABSOLUTE): 0.2 10*3/uL (ref 0.0–0.4)
Eos: 3 %
Hematocrit: 38.8 % (ref 34.0–46.6)
Hemoglobin: 13.4 g/dL (ref 11.1–15.9)
Immature Grans (Abs): 0 10*3/uL (ref 0.0–0.1)
Immature Granulocytes: 0 %
Lymphocytes Absolute: 2 10*3/uL (ref 0.7–3.1)
Lymphs: 35 %
MCH: 30 pg (ref 26.6–33.0)
MCHC: 34.5 g/dL (ref 31.5–35.7)
MCV: 87 fL (ref 79–97)
Monocytes Absolute: 0.5 10*3/uL (ref 0.1–0.9)
Monocytes: 8 %
Neutrophils Absolute: 3.1 10*3/uL (ref 1.4–7.0)
Neutrophils: 53 %
Platelets: 206 10*3/uL (ref 150–450)
RBC: 4.47 x10E6/uL (ref 3.77–5.28)
RDW: 12.8 % (ref 11.7–15.4)
WBC: 5.7 10*3/uL (ref 3.4–10.8)

## 2021-03-31 LAB — CMP14+EGFR
ALT: 14 IU/L (ref 0–32)
AST: 16 IU/L (ref 0–40)
Albumin/Globulin Ratio: 1.9 (ref 1.2–2.2)
Albumin: 4.3 g/dL (ref 3.8–4.9)
Alkaline Phosphatase: 107 IU/L (ref 44–121)
BUN/Creatinine Ratio: 23 (ref 9–23)
BUN: 19 mg/dL (ref 6–24)
Bilirubin Total: 0.6 mg/dL (ref 0.0–1.2)
CO2: 27 mmol/L (ref 20–29)
Calcium: 9.6 mg/dL (ref 8.7–10.2)
Chloride: 102 mmol/L (ref 96–106)
Creatinine, Ser: 0.83 mg/dL (ref 0.57–1.00)
Globulin, Total: 2.3 g/dL (ref 1.5–4.5)
Glucose: 100 mg/dL — ABNORMAL HIGH (ref 65–99)
Potassium: 3.5 mmol/L (ref 3.5–5.2)
Sodium: 142 mmol/L (ref 134–144)
Total Protein: 6.6 g/dL (ref 6.0–8.5)
eGFR: 82 mL/min/{1.73_m2} (ref >59)

## 2021-03-31 LAB — LIPID PANEL
Chol/HDL Ratio: 5.4 ratio — ABNORMAL HIGH (ref 0.0–4.4)
Cholesterol, Total: 190 mg/dL (ref 100–199)
HDL: 35 mg/dL — ABNORMAL LOW (ref >39)
LDL Chol Calc (NIH): 123 mg/dL — ABNORMAL HIGH (ref 0–99)
Triglycerides: 181 mg/dL — ABNORMAL HIGH (ref 0–149)
VLDL Cholesterol Cal: 32 mg/dL (ref 5–40)

## 2021-03-31 LAB — TSH: TSH: 1.62 u[IU]/mL (ref 0.450–4.500)

## 2021-07-04 ENCOUNTER — Encounter: Payer: Self-pay | Admitting: Family Medicine

## 2021-07-04 ENCOUNTER — Ambulatory Visit: Payer: BC Managed Care – PPO | Admitting: Family Medicine

## 2021-07-04 ENCOUNTER — Other Ambulatory Visit: Payer: Self-pay

## 2021-07-04 VITALS — BP 133/85 | HR 74 | Temp 98.3°F | Ht 62.0 in | Wt 201.4 lb

## 2021-07-04 DIAGNOSIS — J309 Allergic rhinitis, unspecified: Secondary | ICD-10-CM

## 2021-07-04 DIAGNOSIS — E66812 Obesity, class 2: Secondary | ICD-10-CM | POA: Insufficient documentation

## 2021-07-04 DIAGNOSIS — I10 Essential (primary) hypertension: Secondary | ICD-10-CM

## 2021-07-04 DIAGNOSIS — E782 Mixed hyperlipidemia: Secondary | ICD-10-CM | POA: Diagnosis not present

## 2021-07-04 NOTE — Patient Instructions (Signed)

## 2021-07-04 NOTE — Progress Notes (Signed)
Established Patient Office Visit  Subjective:  Patient ID: Cindy Hall, female    DOB: [birth year]  Age: 55 y.o. MRN: [id number]  CC:  Chief Complaint  Patient presents with   Hypertension    HPI CANDIES PALM presents for chronic follow up.   HTN Complaint with meds - Yes Current Medications - lisinopril 10 mg, HCTZ 25 mg Exercising Regularly - walking 3-4x a week for 20 minutes Watching Salt intake - Yes Pertinent ROS:  Headache - No Fatigue - No Visual Disturbances - No Chest pain - No Dyspnea - No Palpitations - No LE edema - No  She has had a cough for the last 1-2 weeks. It is a dry cough. It is intermittent. She has postnasal drip and head congestion at night.   BP Readings from Last 3 Encounters:  07/04/21 133/85  03/30/21 (!) 129/92  02/19/21 (!) 154/93   CMP Latest Ref Rng & Units 03/30/2021 05/03/2018 05/02/2018  Glucose 65 - 99 mg/dL 100(H) 145(H) 109(H)  BUN 6 - 24 mg/dL 19 10 14   Creatinine 0.57 - 1.00 mg/dL 0.83 0.90 0.79  Sodium 134 - 144 mmol/L 142 142 142  Potassium 3.5 - 5.2 mmol/L 3.5 4.0 4.1  Chloride 96 - 106 mmol/L 102 106 104  CO2 20 - 29 mmol/L 27 29 28   Calcium 8.7 - 10.2 mg/dL 9.6 8.5(L) 8.9  Total Protein 6.0 - 8.5 g/dL 6.6 6.0(L) 6.0(L)  Total Bilirubin 0.0 - 1.2 mg/dL 0.6 1.0 1.6(H)  Alkaline Phos 44 - 121 IU/L 107 126 165(H)  AST 0 - 40 IU/L 16 35 61(H)  ALT 0 - 32 IU/L 14 85(H) 109(H)      Past Medical History:  Diagnosis Date   Endometriosis determined by laparoscopy    Family history of adverse reaction to anesthesia    " my father had to be put on a ventilator after surgery "   Gallstones 04/30/2018    Past Surgical History:  Procedure Laterality Date   APPENDECTOMY     ruptured appy   CHOLECYSTECTOMY N/A 05/02/2018   Procedure: LAPAROSCOPIC CHOLECYSTECTOMY WITH POSSIBLE INTRAOPERATIVE CHOLANGIOGRAM;  Surgeon: Ralene Ok, MD;  Location: [employer];  Service: General;  Laterality: N/A;   ENDOSCOPIC RETROGRADE  CHOLANGIOPANCREATOGRAPHY (ERCP) WITH PROPOFOL N/A 05/01/2018   Procedure: ENDOSCOPIC RETROGRADE CHOLANGIOPANCREATOGRAPHY (ERCP) WITH PROPOFOL;  Surgeon: Ladene Artist, MD;  Location: [facility] ENDOSCOPY;  Service: Endoscopy;  Laterality: N/A;   REMOVAL OF STONES  05/01/2018   Procedure: REMOVAL OF STONES;  Surgeon: Ladene Artist, MD;  Location: [facility] ENDOSCOPY;  Service: Endoscopy;;   RIGHT OOPHORECTOMY     SPHINCTEROTOMY  05/01/2018   Procedure: Joan Mayans;  Surgeon: Ladene Artist, MD;  Location: [facility] ENDOSCOPY;  Service: Endoscopy;;   UMBILICAL HERNIA REPAIR      Family History  Problem Relation Age of Onset   Colon cancer Mother 37   Cancer Father        prostate   Hypertension Brother    Gallbladder disease Neg Hx     Social History   Socioeconomic History   Marital status: Divorced    Spouse name: Not on file   Number of children: 2   Years of education: 12   Highest education level: High school graduate  Occupational History   Occupation: Control and instrumentation engineer, [employer]  Tobacco Use   Smoking status: Never   Smokeless tobacco: Never  Vaping Use   Vaping Use: Never used  Substance and Sexual Activity  Alcohol use: Never   Drug use: Never   Sexual activity: Yes    Birth control/protection: Surgical  Other Topics Concern   Not on file  Social History Narrative   Not on file   Social Determinants of Health   Financial Resource Strain: Not on file  Food Insecurity: Not on file  Transportation Needs: Not on file  Physical Activity: Not on file  Stress: Not on file  Social Connections: Not on file  Intimate Partner Violence: Not on file    Outpatient Medications Prior to Visit  Medication Sig Dispense Refill   hydrochlorothiazide (HYDRODIURIL) 25 MG tablet Take 1 tablet (25 mg total) by mouth daily. 90 tablet 3   ibuprofen (ADVIL,MOTRIN) 400 MG tablet Take 400 mg by mouth every 6 (six) hours as needed for pain.     lisinopril (ZESTRIL) 10 MG  tablet Take 1 tablet (10 mg total) by mouth daily. 90 tablet 3   Multiple Vitamin (MULTIVITAMIN WITH MINERALS) TABS tablet Take 1 tablet by mouth daily.     No facility-administered medications prior to visit.    No Known Allergies  ROS Review of Systems Negative unless specially indicated above in HPI.   Objective:    Physical Exam Vitals and nursing note reviewed.  Constitutional:      General: She is not in acute distress.    Appearance: She is not ill-appearing, toxic-appearing or diaphoretic.  Cardiovascular:     Rate and Rhythm: Normal rate and regular rhythm.     Heart sounds: Normal heart sounds. No murmur heard. Pulmonary:     Effort: Pulmonary effort is normal. No respiratory distress.     Breath sounds: Normal breath sounds.  Musculoskeletal:     Right lower leg: No edema.     Left lower leg: No edema.  Skin:    General: Skin is warm and dry.  Neurological:     General: No focal deficit present.     Mental Status: She is alert and oriented to person, place, and time.  Psychiatric:        Mood and Affect: Mood normal.        Behavior: Behavior normal.    BP 133/85   Pulse 74   Temp 98.3 F (36.8 C) (Temporal)   Ht 5' 2"  (1.575 m)   Wt 201 lb 6 oz (91.3 kg)   BMI 36.83 kg/m  Wt Readings from Last 3 Encounters:  07/04/21 201 lb 6 oz (91.3 kg)  03/30/21 202 lb 3.2 oz (91.7 kg)  02/19/21 204 lb 6 oz (92.7 kg)     There are no preventive care reminders to display for this patient.  There are no preventive care reminders to display for this patient.  Lab Results  Component Value Date   TSH 1.620 03/30/2021   Lab Results  Component Value Date   WBC 5.7 03/30/2021   HGB 13.4 03/30/2021   HCT 38.8 03/30/2021   MCV 87 03/30/2021   PLT 206 03/30/2021   Lab Results  Component Value Date   NA 142 03/30/2021   K 3.5 03/30/2021   CO2 27 03/30/2021   GLUCOSE 100 (H) 03/30/2021   BUN 19 03/30/2021   CREATININE 0.83 03/30/2021   BILITOT 0.6  03/30/2021   ALKPHOS 107 03/30/2021   AST 16 03/30/2021   ALT 14 03/30/2021   PROT 6.6 03/30/2021   ALBUMIN 4.3 03/30/2021   CALCIUM 9.6 03/30/2021   ANIONGAP 7 05/03/2018   EGFR 82 03/30/2021  Lab Results  Component Value Date   CHOL 190 03/30/2021   Lab Results  Component Value Date   HDL 35 (L) 03/30/2021   Lab Results  Component Value Date   LDLCALC 123 (H) 03/30/2021   Lab Results  Component Value Date   TRIG 181 (H) 03/30/2021   Lab Results  Component Value Date   CHOLHDL 5.4 (H) 03/30/2021   No results found for: HGBA1C    Assessment & Plan:   Marlyne was seen today for hypertension.  Diagnoses and all orders for this visit:  Primary hypertension Well controlled on current regimen.   Mixed hyperlipidemia The 10-year ASCVD risk score (Arnett DK, et al., 2019) is: 4.9%. Diet and exercise. Will recheck at next appointment.   Morbid obesity (HCC) BMI of 36 with HTN and HLD.  Allergic rhinitis, unspecified seasonality, unspecified trigger Try zyrtec and/or flonase OTC.   Follow-up: Return in about 6 months (around 01/02/2022) for chronic follow up with labs.  The patient indicates understanding of these issues and agrees with the plan.    Gwenlyn Perking, FNP

## 2022-01-02 ENCOUNTER — Ambulatory Visit: Payer: BC Managed Care – PPO | Admitting: Family Medicine

## 2022-01-16 ENCOUNTER — Ambulatory Visit: Payer: BC Managed Care – PPO | Admitting: Family Medicine

## 2022-01-22 ENCOUNTER — Telehealth: Payer: Self-pay | Admitting: Physician Assistant

## 2022-01-22 ENCOUNTER — Telehealth: Payer: BC Managed Care – PPO | Admitting: Family Medicine

## 2022-01-22 DIAGNOSIS — R197 Diarrhea, unspecified: Secondary | ICD-10-CM

## 2022-01-22 DIAGNOSIS — B349 Viral infection, unspecified: Secondary | ICD-10-CM

## 2022-01-22 MED ORDER — BENZONATATE 100 MG PO CAPS: 100.0000 mg | ORAL_CAPSULE | Freq: Three times a day (TID) | ORAL | 0 refills | Status: DC | PRN

## 2022-01-22 NOTE — Progress Notes (Signed)

## 2022-01-22 NOTE — Progress Notes (Signed)
We are sorry that you are not feeling well.  Here is how we plan to help!  Based on what you have shared with me it looks like you have Acute Infectious Diarrhea.  Most cases of acute diarrhea are due to infections with virus and bacteria and are self-limited conditions lasting less than 14 days.  For your symptoms you may take Imodium 2 mg tablets that are over the counter at your local pharmacy. Take two tablet now and then one after each loose stool up to 6 a day.  Antibiotics are not needed for most people with diarrhea.   HOME CARE We recommend changing your diet to help with your symptoms for the next few days. Drink plenty of fluids that contain water salt and sugar. Sports drinks such as Gatorade may help.  You may try broths, soups, bananas, applesauce, soft breads, mashed potatoes or crackers.  You are considered infectious for as long as the diarrhea continues. Hand washing or use of alcohol based hand sanitizers is recommend. It is best to stay out of work or school until your symptoms stop.   GET HELP RIGHT AWAY If you have dark yellow colored urine or do not pass urine frequently you should drink more fluids.   If your symptoms worsen  If you feel like you are going to pass out (faint) You have a new problem  MAKE SURE YOU  Understand these instructions. Will watch your condition. Will get help right away if you are not doing well or get worse.  Thank you for choosing an e-visit.  Your e-visit answers were reviewed by a board certified advanced clinical practitioner to complete your personal care plan. Depending upon the condition, your plan could have included both over the counter or prescription medications.  Please review your pharmacy choice. Make sure the pharmacy is open so you can pick up prescription now. If there is a problem, you may contact your provider through MyChart messaging and have the prescription routed to another pharmacy.  Your safety is important  to us. If you have drug allergies check your prescription carefully.   For the next 24 hours you can use MyChart to ask questions about today's visit, request a non-urgent call back, or ask for a work or school excuse. You will get an email in the next two days asking about your experience. I hope that your e-visit has been valuable and will speed your recovery.  I provided 5 minutes of non face-to-face time during this encounter for chart review, medication and order placement, as well as and documentation.   

## 2022-01-22 NOTE — Progress Notes (Signed)
I have spent 5 minutes in review of e-visit questionnaire, review and updating patient chart, medical decision making and response to patient.   Vani Gunner Cody Amilia Vandenbrink, PA-C    

## 2022-01-30 ENCOUNTER — Ambulatory Visit: Payer: BC Managed Care – PPO | Admitting: Family Medicine

## 2022-02-11 ENCOUNTER — Other Ambulatory Visit: Payer: Self-pay | Admitting: Family Medicine

## 2022-02-11 DIAGNOSIS — I10 Essential (primary) hypertension: Secondary | ICD-10-CM

## 2022-02-21 ENCOUNTER — Ambulatory Visit: Payer: BC Managed Care – PPO | Admitting: Family Medicine

## 2022-03-06 ENCOUNTER — Ambulatory Visit: Payer: BC Managed Care – PPO | Admitting: Family Medicine

## 2022-03-11 ENCOUNTER — Other Ambulatory Visit: Payer: Self-pay | Admitting: Family Medicine

## 2022-03-11 DIAGNOSIS — I10 Essential (primary) hypertension: Secondary | ICD-10-CM

## 2022-03-13 ENCOUNTER — Ambulatory Visit: Payer: BC Managed Care – PPO | Admitting: Family Medicine

## 2022-03-20 ENCOUNTER — Ambulatory Visit: Payer: BC Managed Care – PPO | Admitting: Family Medicine

## 2022-03-20 NOTE — Patient Instructions (Addendum)

## 2022-03-21 ENCOUNTER — Encounter: Payer: Self-pay | Admitting: Family Medicine

## 2022-03-25 ENCOUNTER — Encounter: Payer: Self-pay | Admitting: Family Medicine

## 2022-03-25 ENCOUNTER — Ambulatory Visit: Payer: BC Managed Care – PPO | Admitting: Family Medicine

## 2022-03-25 VITALS — BP 138/82 | HR 71 | Temp 98.1°F | Ht 62.0 in | Wt 192.0 lb

## 2022-03-25 DIAGNOSIS — R051 Acute cough: Secondary | ICD-10-CM

## 2022-03-25 DIAGNOSIS — E782 Mixed hyperlipidemia: Secondary | ICD-10-CM

## 2022-03-25 DIAGNOSIS — I10 Essential (primary) hypertension: Secondary | ICD-10-CM | POA: Diagnosis not present

## 2022-03-25 MED ORDER — BENZONATATE 100 MG PO CAPS: 100.0000 mg | ORAL_CAPSULE | Freq: Three times a day (TID) | ORAL | 0 refills | Status: DC | PRN

## 2022-03-25 MED ORDER — HYDROCHLOROTHIAZIDE 25 MG PO TABS: 25.0000 mg | ORAL_TABLET | Freq: Every day | ORAL | 3 refills | Status: DC

## 2022-03-25 MED ORDER — LISINOPRIL 10 MG PO TABS: 10.0000 mg | ORAL_TABLET | Freq: Every day | ORAL | 0 refills | Status: DC

## 2022-03-25 MED ORDER — LISINOPRIL 10 MG PO TABS: 10.0000 mg | ORAL_TABLET | Freq: Every day | ORAL | 1 refills | Status: DC

## 2022-03-25 NOTE — Progress Notes (Signed)
Established Patient Office Visit  Subjective   Patient ID: Cindy Hall, female    DOB: May 13, 1966  Age: 56 y.o. MRN: [id number]  Chief Complaint  Patient presents with   Medical Management of Chronic Issues   Hypertension    Hypertension Pertinent negatives include no blurred vision, chest pain, headaches, orthopnea, palpitations, PND or shortness of breath.   [name] is here for a chronic follow up. She reports doing well. She has been taking medication as prescribed. She has been checking her BP at home and has had readings of 130s/80s. She has lost 10 lbs. She has been working to improve her diet.  Reports cough for 4-5 days. Has been improving. Cough is worse at night. It feel like something is stuck in her throat. She has had some hoarseness and some nasal congestion as well.   She has had some increased stress lately related to her teenage son. She has been doing some counseling with her son and this has been helpful.      03/25/2022   10:58 AM 07/04/2021    3:58 PM 03/30/2021    3:47 PM  Depression screen PHQ 2/9  Decreased Interest 1 0 0  Down, Depressed, Hopeless 1 0 0  PHQ - 2 Score 2 0 0  Altered sleeping 1 1 1   Tired, decreased energy 1 1 0  Change in appetite 0 0 0  Feeling bad or failure about yourself  1 0 0  Trouble concentrating 0 0 0  Moving slowly or fidgety/restless 0 0 0  Suicidal thoughts 0 0 0  PHQ-9 Score 5 2 1   Difficult doing work/chores Not difficult at all Not difficult at all       03/25/2022   10:59 AM 07/04/2021    3:59 PM 03/30/2021    3:47 PM 02/19/2021    3:50 PM  GAD 7 : Generalized Anxiety Score  Nervous, Anxious, on Edge 0 0 0 0  Control/stop worrying 1 0 0 0  Worry too much - different things 1 1 0 0  Trouble relaxing 0 0 0 0  Restless 0 0 0 0  Easily annoyed or irritable 0 0 0 0  Afraid - awful might happen 0 0 0 0  Total GAD 7 Score 2 1 0 0  Anxiety Difficulty Not difficult at all Not difficult at all  Not difficult at all      Past Medical History:  Diagnosis Date   Endometriosis determined by laparoscopy    Family history of adverse reaction to anesthesia    " my father had to be put on a ventilator after surgery "   Gallstones 04/30/2018      Review of Systems  Constitutional:  Negative for chills, diaphoresis and fever.  HENT:  Positive for congestion. Negative for ear discharge, ear pain, sinus pain and sore throat.   Eyes:  Negative for blurred vision and double vision.  Respiratory:  Positive for cough. Negative for sputum production, shortness of breath and wheezing.   Cardiovascular:  Negative for chest pain, palpitations, orthopnea, leg swelling and PND.  Gastrointestinal:  Negative for abdominal pain, heartburn, nausea and vomiting.  Neurological:  Negative for dizziness, tingling, seizures, loss of consciousness, weakness and headaches.  Psychiatric/Behavioral:  Negative for suicidal ideas.       Objective:     BP 138/82 Comment: per pt at home reading  Pulse 71   Temp 98.1 F (36.7 C) (Temporal)   Ht 5' 2"  (1.575 m)  Wt 192 lb (87.1 kg)   SpO2 96%   BMI 35.12 kg/m  Wt Readings from Last 3 Encounters:  03/25/22 192 lb (87.1 kg)  07/04/21 201 lb 6 oz (91.3 kg)  03/30/21 202 lb 3.2 oz (91.7 kg)      Physical Exam Vitals and nursing note reviewed.  Constitutional:      General: She is not in acute distress.    Appearance: She is not ill-appearing, toxic-appearing or diaphoretic.  HENT:     Head: Normocephalic and atraumatic.     Nose: Congestion present.     Mouth/Throat:     Mouth: Mucous membranes are moist.     Pharynx: Oropharynx is clear.  Eyes:     Conjunctiva/sclera: Conjunctivae normal.     Pupils: Pupils are equal, round, and reactive to light.  Cardiovascular:     Rate and Rhythm: Normal rate and regular rhythm.     Heart sounds: Normal heart sounds. No murmur heard. Pulmonary:     Effort: Pulmonary effort is normal. No respiratory distress.     Breath  sounds: Normal breath sounds. No wheezing.  Abdominal:     General: Bowel sounds are normal. There is no distension.     Palpations: Abdomen is soft.     Tenderness: There is no abdominal tenderness. There is no guarding or rebound.  Musculoskeletal:     Right lower leg: No edema.     Left lower leg: No edema.  Skin:    General: Skin is warm and dry.  Neurological:     General: No focal deficit present.     Mental Status: She is alert and oriented to person, place, and time.  Psychiatric:        Mood and Affect: Mood normal.      No results found for any visits on 03/25/22.  Last CBC Lab Results  Component Value Date   WBC 5.7 03/30/2021   HGB 13.4 03/30/2021   HCT 38.8 03/30/2021   MCV 87 03/30/2021   MCH 30.0 03/30/2021   RDW 12.8 03/30/2021   PLT 206 24/40/1027   Last metabolic panel Lab Results  Component Value Date   GLUCOSE 100 (H) 03/30/2021   NA 142 03/30/2021   K 3.5 03/30/2021   CL 102 03/30/2021   CO2 27 03/30/2021   BUN 19 03/30/2021   CREATININE 0.83 03/30/2021   EGFR 82 03/30/2021   CALCIUM 9.6 03/30/2021   PROT 6.6 03/30/2021   ALBUMIN 4.3 03/30/2021   LABGLOB 2.3 03/30/2021   AGRATIO 1.9 03/30/2021   BILITOT 0.6 03/30/2021   ALKPHOS 107 03/30/2021   AST 16 03/30/2021   ALT 14 03/30/2021   ANIONGAP 7 05/03/2018   Last lipids Lab Results  Component Value Date   CHOL 190 03/30/2021   HDL 35 (L) 03/30/2021   LDLCALC 123 (H) 03/30/2021   TRIG 181 (H) 03/30/2021   CHOLHDL 5.4 (H) 03/30/2021      The 10-year ASCVD risk score (Arnett DK, et al., 2019) is: 5.7%    Assessment & Plan:   Juanelle was seen today for medical management of chronic issues and hypertension.  Diagnoses and all orders for this visit:  Primary hypertension Well controlled on current regimen. Labs pending. Continue HCTZ, lisinopril.  -     CBC with Differential/Platelet -     CMP14+EGFR -     Lipid panel -     lisinopril (ZESTRIL) 10 MG tablet; Take 1 tablet (10  mg total) by mouth  daily. -     hydrochlorothiazide (HYDRODIURIL) 25 MG tablet; Take 1 tablet (25 mg total) by mouth daily.  Mixed hyperlipidemia Labs pending. Diet and exercise.  -     CBC with Differential/Platelet -     CMP14+EGFR -     Lipid panel  Morbid obesity (HCC) Down 10 lbs. BMI 35 with HLD and HTN. Labs pending. Diet and exercise. Discussed saxenda, declined today but will let me know if she changes her mind.  -     CBC with Differential/Platelet -     CMP14+EGFR -     Lipid panel  Acute cough Discussed OTC antihistamine. Tessalon perles prn.  -     benzonatate (TESSALON) 100 MG capsule; Take 1 capsule (100 mg total) by mouth 3 (three) times daily as needed for cough.   Return in about 6 months (around 09/25/2022) for CPE.   The patient indicates understanding of these issues and agrees with the plan.  Gwenlyn Perking, FNP

## 2022-03-26 LAB — CBC WITH DIFFERENTIAL/PLATELET
Basophils Absolute: 0 10*3/uL (ref 0.0–0.2)
Basos: 1 %
EOS (ABSOLUTE): 0.2 10*3/uL (ref 0.0–0.4)
Eos: 3 %
Hematocrit: 43.8 % (ref 34.0–46.6)
Hemoglobin: 14.4 g/dL (ref 11.1–15.9)
Immature Grans (Abs): 0 10*3/uL (ref 0.0–0.1)
Immature Granulocytes: 0 %
Lymphocytes Absolute: 2 10*3/uL (ref 0.7–3.1)
Lymphs: 41 %
MCH: 29 pg (ref 26.6–33.0)
MCHC: 32.9 g/dL (ref 31.5–35.7)
MCV: 88 fL (ref 79–97)
Monocytes Absolute: 0.5 10*3/uL (ref 0.1–0.9)
Monocytes: 10 %
Neutrophils Absolute: 2.2 10*3/uL (ref 1.4–7.0)
Neutrophils: 45 %
Platelets: 235 10*3/uL (ref 150–450)
RBC: 4.96 x10E6/uL (ref 3.77–5.28)
RDW: 13.1 % (ref 11.7–15.4)
WBC: 4.9 10*3/uL (ref 3.4–10.8)

## 2022-03-26 LAB — CMP14+EGFR
ALT: 15 IU/L (ref 0–32)
AST: 16 IU/L (ref 0–40)
Albumin/Globulin Ratio: 1.7 (ref 1.2–2.2)
Albumin: 4.5 g/dL (ref 3.8–4.9)
Alkaline Phosphatase: 99 IU/L (ref 44–121)
BUN/Creatinine Ratio: 16 (ref 9–23)
BUN: 14 mg/dL (ref 6–24)
Bilirubin Total: 0.6 mg/dL (ref 0.0–1.2)
CO2: 30 mmol/L — ABNORMAL HIGH (ref 20–29)
Calcium: 9.9 mg/dL (ref 8.7–10.2)
Chloride: 101 mmol/L (ref 96–106)
Creatinine, Ser: 0.87 mg/dL (ref 0.57–1.00)
Globulin, Total: 2.6 g/dL (ref 1.5–4.5)
Glucose: 94 mg/dL (ref 70–99)
Potassium: 3.2 mmol/L — ABNORMAL LOW (ref 3.5–5.2)
Sodium: 143 mmol/L (ref 134–144)
Total Protein: 7.1 g/dL (ref 6.0–8.5)
eGFR: 77 mL/min/{1.73_m2} (ref >59)

## 2022-03-26 LAB — LIPID PANEL
Chol/HDL Ratio: 6 ratio — ABNORMAL HIGH (ref 0.0–4.4)
Cholesterol, Total: 197 mg/dL (ref 100–199)
HDL: 33 mg/dL — ABNORMAL LOW (ref >39)
LDL Chol Calc (NIH): 128 mg/dL — ABNORMAL HIGH (ref 0–99)
Triglycerides: 203 mg/dL — ABNORMAL HIGH (ref 0–149)
VLDL Cholesterol Cal: 36 mg/dL (ref 5–40)

## 2022-03-27 ENCOUNTER — Other Ambulatory Visit: Payer: Self-pay | Admitting: Family Medicine

## 2022-03-27 DIAGNOSIS — E782 Mixed hyperlipidemia: Secondary | ICD-10-CM

## 2022-03-27 DIAGNOSIS — E876 Hypokalemia: Secondary | ICD-10-CM

## 2022-03-27 MED ORDER — ROSUVASTATIN CALCIUM 10 MG PO TABS: 10.0000 mg | ORAL_TABLET | Freq: Every day | ORAL | 0 refills | Status: DC

## 2022-03-27 MED ORDER — POTASSIUM CHLORIDE CRYS ER 10 MEQ PO TBCR: 10.0000 meq | EXTENDED_RELEASE_TABLET | Freq: Two times a day (BID) | ORAL | 0 refills | Status: DC

## 2022-05-27 ENCOUNTER — Other Ambulatory Visit: Payer: Self-pay | Admitting: Family Medicine

## 2022-05-27 DIAGNOSIS — E782 Mixed hyperlipidemia: Secondary | ICD-10-CM

## 2022-08-25 ENCOUNTER — Telehealth: Payer: BC Managed Care – PPO | Admitting: Physician Assistant

## 2022-08-25 DIAGNOSIS — B3731 Acute candidiasis of vulva and vagina: Secondary | ICD-10-CM | POA: Diagnosis not present

## 2022-08-25 MED ORDER — FLUCONAZOLE 200 MG PO TABS: 200.0000 mg | ORAL_TABLET | ORAL | 0 refills | Status: DC | PRN

## 2022-08-25 NOTE — Progress Notes (Signed)
E-Visit for Vaginal Symptoms  We are sorry that you are not feeling well. Here is how we plan to help! Based on what you shared with me it looks like you: May have a yeast vaginosis  Vaginosis is an inflammation of the vagina that can result in discharge, itching and pain. The cause is usually a change in the normal balance of vaginal bacteria or an infection. Vaginosis can also result from reduced estrogen levels after menopause.  The most common causes of vaginosis are:   Bacterial vaginosis which results from an overgrowth of one on several organisms that are normally present in your vagina.   Yeast infections which are caused by a naturally occurring fungus called candida.   Vaginal atrophy (atrophic vaginosis) which results from the thinning of the vagina from reduced estrogen levels after menopause.   Trichomoniasis which is caused by a parasite and is commonly transmitted by sexual intercourse.  Factors that increase your risk of developing vaginosis include: Medications, such as antibiotics and steroids Uncontrolled diabetes Use of hygiene products such as bubble bath, vaginal spray or vaginal deodorant Douching Wearing damp or tight-fitting clothing Using an intrauterine device (IUD) for birth control Hormonal changes, such as those associated with pregnancy, birth control pills or menopause Sexual activity Having a sexually transmitted infection  Your treatment plan is A single Diflucan (fluconazole) 200mg tablet once.  I have electronically sent this prescription into the pharmacy that you have chosen.  Be sure to take all of the medication as directed. Stop taking any medication if you develop a rash, tongue swelling or shortness of breath. Mothers who are breast feeding should consider pumping and discarding their breast milk while on these antibiotics. However, there is no consensus that infant exposure at these doses would be harmful.  Remember that medication creams can  weaken latex condoms. .   HOME CARE:  Good hygiene may prevent some types of vaginosis from recurring and may relieve some symptoms:  Avoid baths, hot tubs and whirlpool spas. Rinse soap from your outer genital area after a shower, and dry the area well to prevent irritation. Don't use scented or harsh soaps, such as those with deodorant or antibacterial action. Avoid irritants. These include scented tampons and pads. Wipe from front to back after using the toilet. Doing so avoids spreading fecal bacteria to your vagina.  Other things that may help prevent vaginosis include:  Don't douche. Your vagina doesn't require cleansing other than normal bathing. Repetitive douching disrupts the normal organisms that reside in the vagina and can actually increase your risk of vaginal infection. Douching won't clear up a vaginal infection. Use a latex condom. Both female and female latex condoms may help you avoid infections spread by sexual contact. Wear cotton underwear. Also wear pantyhose with a cotton crotch. If you feel comfortable without it, skip wearing underwear to bed. Yeast thrives in moist environments Your symptoms should improve in the next day or two.  GET HELP RIGHT AWAY IF:  You have pain in your lower abdomen ( pelvic area or over your ovaries) You develop nausea or vomiting You develop a fever Your discharge changes or worsens You have persistent pain with intercourse You develop shortness of breath, a rapid pulse, or you faint.  These symptoms could be signs of problems or infections that need to be evaluated by a medical provider now.  MAKE SURE YOU   Understand these instructions. Will watch your condition. Will get help right away if you are not   doing well or get worse.  Thank you for choosing an e-visit.  Your e-visit answers were reviewed by a board certified advanced clinical practitioner to complete your personal care plan. Depending upon the condition, your plan  could have included both over the counter or prescription medications.  Please review your pharmacy choice. Make sure the pharmacy is open so you can pick up prescription now. If there is a problem, you may contact your provider through MyChart messaging and have the prescription routed to another pharmacy.  Your safety is important to us. If you have drug allergies check your prescription carefully.   For the next 24 hours you can use MyChart to ask questions about today's visit, request a non-urgent call back, or ask for a work or school excuse. You will get an email in the next two days asking about your experience. I hope that your e-visit has been valuable and will speed your recovery.  I have spent 5 minutes in review of e-visit questionnaire, review and updating patient chart, medical decision making and response to patient.   Jolonda Gomm M Verba Ainley, PA-C  

## 2022-08-27 ENCOUNTER — Telehealth: Payer: BC Managed Care – PPO | Admitting: Physician Assistant

## 2022-08-27 DIAGNOSIS — R3989 Other symptoms and signs involving the genitourinary system: Secondary | ICD-10-CM

## 2022-08-27 MED ORDER — CEPHALEXIN 500 MG PO CAPS: 500.0000 mg | ORAL_CAPSULE | Freq: Two times a day (BID) | ORAL | 0 refills | Status: DC

## 2022-08-27 NOTE — Progress Notes (Signed)

## 2022-09-25 ENCOUNTER — Encounter: Payer: BC Managed Care – PPO | Admitting: Family Medicine

## 2022-09-28 ENCOUNTER — Other Ambulatory Visit: Payer: Self-pay | Admitting: Family Medicine

## 2022-09-28 DIAGNOSIS — E782 Mixed hyperlipidemia: Secondary | ICD-10-CM

## 2022-10-01 ENCOUNTER — Other Ambulatory Visit: Payer: Self-pay | Admitting: Family Medicine

## 2022-10-01 DIAGNOSIS — I10 Essential (primary) hypertension: Secondary | ICD-10-CM

## 2022-10-24 ENCOUNTER — Other Ambulatory Visit: Payer: Self-pay | Admitting: Family Medicine

## 2022-10-24 DIAGNOSIS — I10 Essential (primary) hypertension: Secondary | ICD-10-CM

## 2022-10-24 DIAGNOSIS — E782 Mixed hyperlipidemia: Secondary | ICD-10-CM

## 2022-11-07 ENCOUNTER — Telehealth: Payer: BC Managed Care – PPO | Admitting: Physician Assistant

## 2022-11-07 DIAGNOSIS — J208 Acute bronchitis due to other specified organisms: Secondary | ICD-10-CM

## 2022-11-07 MED ORDER — PREDNISONE 20 MG PO TABS: 40.0000 mg | ORAL_TABLET | Freq: Every day | ORAL | 0 refills | Status: DC

## 2022-11-07 MED ORDER — BENZONATATE 100 MG PO CAPS: 100.0000 mg | ORAL_CAPSULE | Freq: Three times a day (TID) | ORAL | 0 refills | Status: DC | PRN

## 2022-11-07 NOTE — Progress Notes (Signed)
We are sorry that you are not feeling well.  Here is how we plan to help!  Based on your presentation I believe you most likely have A cough due to a virus.  This is called viral bronchitis and is best treated by rest, plenty of fluids and control of the cough.  You may use Ibuprofen or Tylenol as directed to help your symptoms.     In addition you may use A prescription cough medication called Tessalon Perles 146m. You may take 1-2 capsules every 8 hours as needed for your cough. I am also prescribing a very short course of prednisone to reduce airway inflammation and calm down spasm that is contributing to coughing episodes.   From your responses in the eVisit questionnaire you describe inflammation in the upper respiratory tract which is causing a significant cough.  This is commonly called Bronchitis and has four common causes:   Allergies Viral Infections Acid Reflux Bacterial Infection Allergies, viruses and acid reflux are treated by controlling symptoms or eliminating the cause. An example might be a cough caused by taking certain blood pressure medications. You stop the cough by changing the medication. Another example might be a cough caused by acid reflux. Controlling the reflux helps control the cough.  USE OF BRONCHODILATOR ("RESCUE") INHALERS: There is a risk from using your bronchodilator too frequently.  The risk is that over-reliance on a medication which only relaxes the muscles surrounding the breathing tubes can reduce the effectiveness of medications prescribed to reduce swelling and congestion of the tubes themselves.  Although you feel brief relief from the bronchodilator inhaler, your asthma may actually be worsening with the tubes becoming more swollen and filled with mucus.  This can delay other crucial treatments, such as oral steroid medications. If you need to use a bronchodilator inhaler daily, several times per day, you should discuss this with your provider.  There are  probably better treatments that could be used to keep your asthma under control.     HOME CARE Only take medications as instructed by your medical team. Complete the entire course of an antibiotic. Drink plenty of fluids and get plenty of rest. Avoid close contacts especially the very young and the elderly Cover your mouth if you cough or cough into your sleeve. Always remember to wash your hands A steam or ultrasonic humidifier can help congestion.   GET HELP RIGHT AWAY IF: You develop worsening fever. You become short of breath You cough up blood. Your symptoms persist after you have completed your treatment plan MAKE SURE YOU  Understand these instructions. Will watch your condition. Will get help right away if you are not doing well or get worse.    Thank you for choosing an e-visit.  Your e-visit answers were reviewed by a board certified advanced clinical practitioner to complete your personal care plan. Depending upon the condition, your plan could have included both over the counter or prescription medications.  Please review your pharmacy choice. Make sure the pharmacy is open so you can pick up prescription now. If there is a problem, you may contact your provider through [employer] have the prescription routed to another pharmacy.  Your safety is important to [place] If you have drug allergies check your prescription carefully.   For the next 24 hours you can use MyChart to ask questions about today's visit, request a non-urgent call back, or ask for a work or school excuse. You will get an email in the next  two days asking about your experience. I hope that your e-visit has been valuable and will speed your recovery.

## 2022-11-07 NOTE — Progress Notes (Signed)
I have spent 5 minutes in review of e-visit questionnaire, review and updating patient chart, medical decision making and response to patient.   Krishauna Schatzman Cody Kel Senn, PA-C    

## 2022-11-21 ENCOUNTER — Encounter: Payer: BC Managed Care – PPO | Admitting: Family Medicine

## 2022-11-24 ENCOUNTER — Telehealth: Payer: BC Managed Care – PPO | Admitting: Family

## 2022-11-24 DIAGNOSIS — J209 Acute bronchitis, unspecified: Secondary | ICD-10-CM | POA: Diagnosis not present

## 2022-11-24 MED ORDER — BENZONATATE 100 MG PO CAPS: 100.0000 mg | ORAL_CAPSULE | Freq: Three times a day (TID) | ORAL | 0 refills | Status: DC | PRN

## 2022-11-24 MED ORDER — CETIRIZINE HCL 10 MG PO TABS: 10.0000 mg | ORAL_TABLET | Freq: Every day | ORAL | 1 refills | Status: DC

## 2022-11-24 MED ORDER — FLUTICASONE PROPIONATE 50 MCG/ACT NA SUSP: 2.0000 | Freq: Every day | NASAL | 6 refills | Status: DC

## 2022-11-24 NOTE — Progress Notes (Signed)
We are sorry that you are not feeling well.  Here is how we plan to help!  Based on your presentation I believe you most likely have A cough due to a virus.  This is called viral bronchitis and is best treated by rest, plenty of fluids and control of the cough.  You may use Ibuprofen or Tylenol as directed to help your symptoms.     In addition you may use A non-prescription cough medication called Robitussin DAC. Take 2 teaspoons every 8 hours or Delsym: take 2 teaspoons every 12 hours., A non-prescription cough medication called Mucinex DM: take 2 tablets every 12 hours., and A prescription cough medication called Tessalon Perles '100mg'$ . You may take 1-2 capsules every 8 hours as needed for your cough. Zyrtec 10 mg and flonase nasal spray.   From your responses in the eVisit questionnaire you describe inflammation in the upper respiratory tract which is causing a significant cough.  This is commonly called Bronchitis and has four common causes:   Allergies Viral Infections Acid Reflux Bacterial Infection Allergies, viruses and acid reflux are treated by controlling symptoms or eliminating the cause. An example might be a cough caused by taking certain blood pressure medications. You stop the cough by changing the medication. Another example might be a cough caused by acid reflux. Controlling the reflux helps control the cough.  USE OF BRONCHODILATOR ("RESCUE") INHALERS: There is a risk from using your bronchodilator too frequently.  The risk is that over-reliance on a medication which only relaxes the muscles surrounding the breathing tubes can reduce the effectiveness of medications prescribed to reduce swelling and congestion of the tubes themselves.  Although you feel brief relief from the bronchodilator inhaler, your asthma may actually be worsening with the tubes becoming more swollen and filled with mucus.  This can delay other crucial treatments, such as oral steroid medications. If you need  to use a bronchodilator inhaler daily, several times per day, you should discuss this with your provider.  There are probably better treatments that could be used to keep your asthma under control.     HOME CARE Only take medications as instructed by your medical team. Complete the entire course of an antibiotic. Drink plenty of fluids and get plenty of rest. Avoid close contacts especially the very young and the elderly Cover your mouth if you cough or cough into your sleeve. Always remember to wash your hands A steam or ultrasonic humidifier can help congestion.   GET HELP RIGHT AWAY IF: You develop worsening fever. You become short of breath You cough up blood. Your symptoms persist after you have completed your treatment plan MAKE SURE YOU  Understand these instructions. Will watch your condition. Will get help right away if you are not doing well or get worse.    Thank you for choosing an e-visit.  Your e-visit answers were reviewed by a board certified advanced clinical practitioner to complete your personal care plan. Depending upon the condition, your plan could have included both over the counter or prescription medications.  Please review your pharmacy choice. Make sure the pharmacy is open so you can pick up prescription now. If there is a problem, you may contact your provider through [employer] and have the prescription routed to another pharmacy.  Your safety is important to [place]. If you have drug allergies check your prescription carefully.   For the next 24 hours you can use MyChart to ask questions about today's visit, request a non-urgent  call back, or ask for a work or school excuse. You will get an email in the next two days asking about your experience. I hope that your e-visit has been valuable and will speed your recovery.  Approximately 5 minutes was spent documenting and reviewing patient's chart.

## 2022-12-31 ENCOUNTER — Telehealth: Payer: BC Managed Care – PPO | Admitting: Nurse Practitioner

## 2022-12-31 DIAGNOSIS — H6990 Unspecified Eustachian tube disorder, unspecified ear: Secondary | ICD-10-CM | POA: Diagnosis not present

## 2022-12-31 MED ORDER — FLUTICASONE PROPIONATE 50 MCG/ACT NA SUSP: 2.0000 | Freq: Every day | NASAL | 6 refills | Status: DC

## 2022-12-31 NOTE — Progress Notes (Signed)
E-Visit for Ear Pain - Eustachian Tube Dysfunction   We are sorry that you are not feeling well. Here is how we plan to help!  Based on what you have shared with me it looks like you have Eustachian Tube Dysfunction.  Eustachian Tube Dysfunction is a condition where the tubes that connect your middle ears to your upper throat become blocked. This can lead to discomfort, hearing difficulties and a feeling of fullness in your ear. Eustachian tube dysfunction usually resolves itself in a few days. The usual symptoms include: Hearing problems Tinnitus, or ringing in your ears Clicking or popping sounds A feeling of fullness in your ears Pain that mimics an ear infection Dizziness, vertigo or balance problems A "tickling" sensation in your ears  Without examining your ears we are assuming this is what is happening based on your symptoms, if your pain persists we would recommend an in person evaluation to check for infection and for possible wax in your ear canal.      What causes eustachian tube dysfunction? Allergies and infections (like the common cold and the flu) are the most common causes of eustachian tube dysfunction. These conditions can cause inflammation and mucus buildup, leading to blockage. GERD, or chronic acid reflux, can also cause ETD. This is because stomach acid can back up into your throat and result in inflammation. As mentioned above, altitude changes can also cause ETD.   What are some common eustachian tube dysfunction treatments? In most cases, treatment isn't necessary because ETD often resolves on its own. However, you might need treatment if your symptoms linger for more than two weeks.    Eustachian tube dysfunction treatment depends on the cause and the severity of your condition. Treatments may include home remedies, medications or, in severe cases, surgery.     HOME CARE: Sometimes simple home remedies can help with mild cases of eustachian tube dysfunction. To  try and clear the blockage, you can: Chew gum. Yawn. Swallow. Try the Valsalva maneuver (breathing out forcefully while closing your mouth and pinching your nostrils). Use a saline spray to clear out nasal passages.  MEDICATIONS: Over-the-counter medications can help if allergies are causing eustachian tube dysfunction. Try antihistamines (like cetirizine or diphenhydramine) to ease your symptoms. If you have discomfort, pain relievers -- such as acetaminophen or ibuprofen -- can help.  Sometimes intranasal glucocorticosteroids (like Flonase or Nasacort) help.  I have prescribed Fluticasone 50 mcg/spray 2 sprays in each nostril daily for 10-14 days    GET HELP RIGHT AWAY IF: Fever is over 102.2 degrees. You develop progressive ear pain or hearing loss. Ear symptoms persist longer than 3 days after treatment.  MAKE SURE YOU: Understand these instructions. Will watch your condition. Will get help right away if you are not doing well or get worse.  Thank you for choosing an e-visit.  Your e-visit answers were reviewed by a board certified advanced clinical practitioner to complete your personal care plan. Depending upon the condition, your plan could have included both over the counter or prescription medications.  Please review your pharmacy choice. Make sure the pharmacy is open so you can pick up the prescription now. If there is a problem, you may contact your provider through [employer] and have the prescription routed to another pharmacy.  Your safety is important to [place]. If you have drug allergies check your prescription carefully.   For the next 24 hours you can use MyChart to ask questions about today's visit, request a non-urgent  call back, or ask for a work or school excuse. You will get an email with a survey after your eVisit asking about your experience. We would appreciate your feedback. I hope that your e-visit has been valuable and will aid in your recovery.  I  spent approximately 5 minutes reviewing the patient's history, current symptoms and coordinating their care today.

## 2023-01-23 ENCOUNTER — Other Ambulatory Visit: Payer: Self-pay | Admitting: Family Medicine

## 2023-01-23 DIAGNOSIS — I10 Essential (primary) hypertension: Secondary | ICD-10-CM

## 2023-01-24 ENCOUNTER — Other Ambulatory Visit: Payer: Self-pay | Admitting: Family Medicine

## 2023-01-24 DIAGNOSIS — E782 Mixed hyperlipidemia: Secondary | ICD-10-CM

## 2023-03-13 ENCOUNTER — Encounter: Payer: BC Managed Care – PPO | Admitting: Family Medicine

## 2023-04-09 ENCOUNTER — Other Ambulatory Visit: Payer: Self-pay | Admitting: Family Medicine

## 2023-04-09 DIAGNOSIS — I10 Essential (primary) hypertension: Secondary | ICD-10-CM

## 2023-04-30 ENCOUNTER — Other Ambulatory Visit: Payer: Self-pay | Admitting: Family Medicine

## 2023-04-30 DIAGNOSIS — I10 Essential (primary) hypertension: Secondary | ICD-10-CM

## 2023-05-05 ENCOUNTER — Other Ambulatory Visit: Payer: Self-pay | Admitting: Family Medicine

## 2023-05-05 DIAGNOSIS — E782 Mixed hyperlipidemia: Secondary | ICD-10-CM

## 2023-06-01 ENCOUNTER — Other Ambulatory Visit: Payer: Self-pay | Admitting: Family

## 2023-06-20 ENCOUNTER — Other Ambulatory Visit: Payer: Self-pay | Admitting: Family Medicine

## 2023-06-20 DIAGNOSIS — Z1211 Encounter for screening for malignant neoplasm of colon: Secondary | ICD-10-CM

## 2023-06-20 DIAGNOSIS — Z1212 Encounter for screening for malignant neoplasm of rectum: Secondary | ICD-10-CM

## 2023-06-27 ENCOUNTER — Encounter: Payer: BC Managed Care – PPO | Admitting: Family Medicine

## 2023-07-11 ENCOUNTER — Other Ambulatory Visit: Payer: Self-pay | Admitting: Family Medicine

## 2023-07-11 DIAGNOSIS — I10 Essential (primary) hypertension: Secondary | ICD-10-CM

## 2023-07-25 ENCOUNTER — Other Ambulatory Visit: Payer: Self-pay | Admitting: Family Medicine

## 2023-07-25 DIAGNOSIS — I10 Essential (primary) hypertension: Secondary | ICD-10-CM

## 2023-08-02 ENCOUNTER — Other Ambulatory Visit: Payer: Self-pay | Admitting: Family Medicine

## 2023-08-02 DIAGNOSIS — E782 Mixed hyperlipidemia: Secondary | ICD-10-CM

## 2023-08-15 ENCOUNTER — Other Ambulatory Visit: Payer: Self-pay | Admitting: Family Medicine

## 2023-08-15 DIAGNOSIS — E782 Mixed hyperlipidemia: Secondary | ICD-10-CM

## 2023-09-18 ENCOUNTER — Ambulatory Visit (INDEPENDENT_AMBULATORY_CARE_PROVIDER_SITE_OTHER): Payer: 59 | Admitting: Family Medicine

## 2023-09-18 VITALS — BP 129/84 | HR 82 | Temp 98.2°F | Ht 62.0 in | Wt 203.6 lb

## 2023-09-18 DIAGNOSIS — E876 Hypokalemia: Secondary | ICD-10-CM

## 2023-09-18 DIAGNOSIS — Z0001 Encounter for general adult medical examination with abnormal findings: Secondary | ICD-10-CM | POA: Diagnosis not present

## 2023-09-18 DIAGNOSIS — F419 Anxiety disorder, unspecified: Secondary | ICD-10-CM | POA: Diagnosis not present

## 2023-09-18 DIAGNOSIS — Z23 Encounter for immunization: Secondary | ICD-10-CM | POA: Diagnosis not present

## 2023-09-18 DIAGNOSIS — Z0000 Encounter for general adult medical examination without abnormal findings: Secondary | ICD-10-CM

## 2023-09-18 DIAGNOSIS — E782 Mixed hyperlipidemia: Secondary | ICD-10-CM | POA: Diagnosis not present

## 2023-09-18 DIAGNOSIS — I10 Essential (primary) hypertension: Secondary | ICD-10-CM

## 2023-09-18 DIAGNOSIS — F339 Major depressive disorder, recurrent, unspecified: Secondary | ICD-10-CM | POA: Diagnosis not present

## 2023-09-18 MED ORDER — LISINOPRIL 10 MG PO TABS: 10.0000 mg | ORAL_TABLET | Freq: Every day | ORAL | 0 refills | Status: DC

## 2023-09-18 MED ORDER — HYDROCHLOROTHIAZIDE 25 MG PO TABS: 25.0000 mg | ORAL_TABLET | Freq: Every day | ORAL | 0 refills | Status: DC

## 2023-09-18 MED ORDER — SERTRALINE HCL 50 MG PO TABS: 50.0000 mg | ORAL_TABLET | Freq: Every day | ORAL | 1 refills | Status: DC

## 2023-09-18 MED ORDER — ROSUVASTATIN CALCIUM 10 MG PO TABS: 10.0000 mg | ORAL_TABLET | Freq: Every day | ORAL | 0 refills | Status: DC

## 2023-09-18 NOTE — Progress Notes (Signed)
 Complete physical exam  Patient: Cindy Hall   DOB: 22-Apr-1966   58 y.o. Female  MRN: [id number]  Subjective:    Chief Complaint  Patient presents with   Annual Exam    Cindy Hall is a 58 y.o. female who presents today for a complete physical exam. She reports consuming a general diet. The patient does not participate in regular exercise at present. She generally feels fairly well. She reports sleeping fairly well. She does have additional problems to discuss today.    She would like to discuss anxiety and depression. She reports recurrent episodes of mild depression. She primarily struggles with anxiety however. She feels like she worries about many different things much more than normal. She often struggles to sleep because of her anxiety. She previously was on an SSRI after a divorce in 2011 with good relief. Believes it may have been zoloft . She is interested in trying medication again. She has tried counseling in the past but didn't feel like it was very beneficial.   She has not taken her Crestor  in about 2 months. The pharmacy didn't notify her that it had been filled.       09/18/2023    4:10 PM 03/25/2022   10:58 AM 07/04/2021    3:58 PM  Depression screen PHQ 2/9  Decreased Interest 0 1 0  Down, Depressed, Hopeless 1 1 0  PHQ - 2 Score 1 2 0  Altered sleeping 1 1 1   Tired, decreased energy 1 1 1   Change in appetite 0 0 0  Feeling bad or failure about yourself  1 1 0  Trouble concentrating 0 0 0  Moving slowly or fidgety/restless 0 0 0  Suicidal thoughts 0 0 0  PHQ-9 Score 4 5 2   Difficult doing work/chores Not difficult at all Not difficult at all Not difficult at all      09/18/2023    4:10 PM 03/25/2022   10:59 AM 07/04/2021    3:59 PM 03/30/2021    3:47 PM  GAD 7 : Generalized Anxiety Score  Nervous, Anxious, on Edge 1 0 0 0  Control/stop worrying 1 1 0 0  Worry too much - different things 1 1 1  0  Trouble relaxing 1 0 0 0  Restless 0 0 0 0  Easily  annoyed or irritable 0 0 0 0  Afraid - awful might happen 0 0 0 0  Total GAD 7 Score 4 2 1  0  Anxiety Difficulty Not difficult at all Not difficult at all Not difficult at all       Most recent fall risk assessment:    03/25/2022   10:58 AM  Fall Risk   Falls in the past year? 0     Most recent depression screenings:    03/25/2022   10:58 AM 07/04/2021    3:58 PM  PHQ 2/9 Scores  PHQ - 2 Score 2 0  PHQ- 9 Score 5 2    Vision:Not within last year  and Dental: No current dental problems and No regular dental care   Past Medical History:  Diagnosis Date   Endometriosis determined by laparoscopy    Family history of adverse reaction to anesthesia     my father had to be put on a ventilator after surgery    Gallstones 04/30/2018      Patient Care Team: Joesph Annabella HERO, FNP as PCP - General (Family Medicine)   Outpatient Medications Prior to Visit  Medication Sig  cetirizine  (ZYRTEC ) 10 MG tablet TAKE 1 TABLET BY MOUTH EVERY DAY   hydrochlorothiazide  (HYDRODIURIL ) 25 MG tablet TAKE 1 TABLET (25 MG TOTAL) BY MOUTH DAILY.   ibuprofen (ADVIL,MOTRIN) 400 MG tablet Take 400 mg by mouth every 6 (six) hours as needed for pain.   lisinopril  (ZESTRIL ) 10 MG tablet TAKE 1 TABLET BY MOUTH EVERY DAY   Multiple Vitamin (MULTIVITAMIN WITH MINERALS) TABS tablet Take 1 tablet by mouth daily.   rosuvastatin  (CRESTOR ) 10 MG tablet TAKE 1 TABLET BY MOUTH EVERY DAY (Patient not taking: Reported on 09/18/2023)   [DISCONTINUED] benzonatate  (TESSALON  PERLES) 100 MG capsule Take 1 capsule (100 mg total) by mouth 3 (three) times daily as needed.   [DISCONTINUED] fluticasone  (FLONASE ) 50 MCG/ACT nasal spray Place 2 sprays into both nostrils daily.   [DISCONTINUED] fluticasone  (FLONASE ) 50 MCG/ACT nasal spray Place 2 sprays into both nostrils daily.   [DISCONTINUED] potassium chloride  (KLOR-CON  M) 10 MEQ tablet Take 1 tablet (10 mEq total) by mouth 2 (two) times daily for 5 days.   [DISCONTINUED]  predniSONE  (DELTASONE ) 20 MG tablet Take 2 tablets (40 mg total) by mouth daily with breakfast.   No facility-administered medications prior to visit.    ROS        Objective:     BP 129/84   Pulse 82   Temp 98.2 F (36.8 C) (Temporal)   Ht 5' 2 (1.575 m)   Wt 203 lb 9.6 oz (92.4 kg)   SpO2 97%   BMI 37.24 kg/m    Physical Exam Vitals and nursing note reviewed.  Constitutional:      General: She is not in acute distress.    Appearance: She is obese. She is not ill-appearing, toxic-appearing or diaphoretic.  HENT:     Head: Normocephalic.     Right Ear: Tympanic membrane, ear canal and external ear normal.     Left Ear: Tympanic membrane, ear canal and external ear normal.     Nose: Nose normal.     Mouth/Throat:     Mouth: Mucous membranes are moist.     Pharynx: Oropharynx is clear.  Eyes:     Extraocular Movements: Extraocular movements intact.     Conjunctiva/sclera: Conjunctivae normal.     Pupils: Pupils are equal, round, and reactive to light.  Neck:     Thyroid: No thyroid mass, thyromegaly or thyroid tenderness.  Cardiovascular:     Rate and Rhythm: Normal rate and regular rhythm.     Pulses: Normal pulses.     Heart sounds: Normal heart sounds. No murmur heard.    No friction rub. No gallop.  Pulmonary:     Effort: Pulmonary effort is normal.     Breath sounds: Normal breath sounds.  Abdominal:     General: Bowel sounds are normal. There is no distension.     Palpations: Abdomen is soft. There is no mass.     Tenderness: There is no abdominal tenderness. There is no guarding.  Musculoskeletal:        General: Normal range of motion.     Cervical back: Normal range of motion and neck supple. No tenderness.     Right lower leg: No edema.     Left lower leg: No edema.  Skin:    General: Skin is warm and dry.     Capillary Refill: Capillary refill takes less than 2 seconds.     Findings: No lesion or rash.  Neurological:     General: No focal  deficit present.     Mental Status: She is alert and oriented to person, place, and time.     Cranial Nerves: No cranial nerve deficit.     Motor: No weakness.     Coordination: Coordination normal.     Gait: Gait normal.  Psychiatric:        Mood and Affect: Mood normal.        Behavior: Behavior normal.        Thought Content: Thought content normal.        Judgment: Judgment normal.      No results found for any visits on 09/18/23.     Assessment & Plan:    Routine Health Maintenance and Physical Exam  Shadiamond was seen today for annual exam.  Diagnoses and all orders for this visit:  Routine general medical examination at a health care facility  Primary hypertension Well controlled on current regimen.  -     CBC with Differential/Platelet -     CMP14+EGFR -     TSH -     hydrochlorothiazide  (HYDRODIURIL ) 25 MG tablet; Take 1 tablet (25 mg total) by mouth daily. -     lisinopril  (ZESTRIL ) 10 MG tablet; Take 1 tablet (10 mg total) by mouth daily.  Mixed hyperlipidemia Restart crestor . Will check fasting lipid panel at next visit.  -     rosuvastatin  (CRESTOR ) 10 MG tablet; Take 1 tablet (10 mg total) by mouth daily.  Hypokalemia Repeat today.  -     CMP14+EGFR  Morbid obesity (HCC) Diet, exercise, weight loss.  -     Bayer DCA Hb A1c Waived -     VITAMIN D  25 Hydroxy (Vit-D Deficiency, Fractures)  Anxiety Depression, recurrent (HCC) Start zoloft  as below. Follow up in 6 weeks for recheck.  -     sertraline  (ZOLOFT ) 50 MG tablet; Take 1 tablet (50 mg total) by mouth daily.  Encounter for immunization -     Flu vaccine trivalent PF, 6mos and older(Flulaval,Afluria,Fluarix,Fluzone)  Need for vaccination -     Tdap vaccine greater than or equal to 7yo IM    Immunization History  Administered Date(s) Administered   Moderna Sars-Covid-2 Vaccination 12/15/2019, 01/12/2020    Health Maintenance  Topic Date Due   DTaP/Tdap/Td (1 - Tdap) Never done    MAMMOGRAM  Never done   INFLUENZA VACCINE  Never done   Cervical Cancer Screening (HPV/Pap Cotest)  09/17/2024 (Originally 01/10/1994)   Hepatitis C Screening  09/17/2024 (Originally 01/10/1982)   Fecal DNA (Cologuard)  09/17/2024 (Originally 01/10/2009)   COVID-19 Vaccine (3 - 2024-25 season) 10/03/2024 (Originally 05/18/2023)   Zoster Vaccines- Shingrix (1 of 2) 12/16/2024 (Originally 01/10/2014)   HIV Screening  Completed   HPV VACCINES  Aged Out    Discussed health benefits of physical activity, and encouraged her to engage in regular exercise appropriate for her age and condition.  Problem List Items Addressed This Visit       Cardiovascular and Mediastinum   Primary hypertension   Relevant Medications   rosuvastatin  (CRESTOR ) 10 MG tablet   hydrochlorothiazide  (HYDRODIURIL ) 25 MG tablet   lisinopril  (ZESTRIL ) 10 MG tablet   Other Relevant Orders   CBC with Differential/Platelet   CMP14+EGFR   TSH     Other   Mixed hyperlipidemia   Relevant Medications   rosuvastatin  (CRESTOR ) 10 MG tablet   hydrochlorothiazide  (HYDRODIURIL ) 25 MG tablet   lisinopril  (ZESTRIL ) 10 MG tablet   Morbid obesity (HCC)   Relevant Orders  Bayer DCA Hb A1c Waived   VITAMIN D  25 Hydroxy (Vit-D Deficiency, Fractures)   Hypokalemia   Relevant Orders   CMP14+EGFR   Other Visit Diagnoses       Routine general medical examination at a health care facility    -  Primary     Anxiety       Relevant Medications   sertraline  (ZOLOFT ) 50 MG tablet     Depression, recurrent (HCC)       Relevant Medications   sertraline  (ZOLOFT ) 50 MG tablet     Encounter for immunization       Relevant Orders   Flu vaccine trivalent PF, 6mos and older(Flulaval,Afluria,Fluarix,Fluzone) (Completed)     Need for vaccination       Relevant Orders   Tdap vaccine greater than or equal to 7yo IM (Completed)      Return in about 6 weeks (around 10/30/2023) for medication follow up.   The patient indicates understanding  of these issues and agrees with the plan.  Annabella CHRISTELLA Search, FNP

## 2023-09-18 NOTE — Patient Instructions (Signed)

## 2023-09-19 ENCOUNTER — Other Ambulatory Visit: Payer: Self-pay | Admitting: Family Medicine

## 2023-09-19 DIAGNOSIS — E876 Hypokalemia: Secondary | ICD-10-CM

## 2023-09-19 LAB — CBC WITH DIFFERENTIAL/PLATELET
Basophils Absolute: 0 10*3/uL (ref 0.0–0.2)
Basos: 0 %
EOS (ABSOLUTE): 0.3 10*3/uL (ref 0.0–0.4)
Eos: 3 %
Hematocrit: 44.4 % (ref 34.0–46.6)
Hemoglobin: 14.9 g/dL (ref 11.1–15.9)
Immature Grans (Abs): 0 10*3/uL (ref 0.0–0.1)
Immature Granulocytes: 0 %
Lymphocytes Absolute: 2.1 10*3/uL (ref 0.7–3.1)
Lymphs: 25 %
MCH: 29.9 pg (ref 26.6–33.0)
MCHC: 33.6 g/dL (ref 31.5–35.7)
MCV: 89 fL (ref 79–97)
Monocytes Absolute: 0.7 10*3/uL (ref 0.1–0.9)
Monocytes: 8 %
Neutrophils Absolute: 5.4 10*3/uL (ref 1.4–7.0)
Neutrophils: 64 %
Platelets: 266 10*3/uL (ref 150–450)
RBC: 4.99 x10E6/uL (ref 3.77–5.28)
RDW: 13.1 % (ref 11.7–15.4)
WBC: 8.4 10*3/uL (ref 3.4–10.8)

## 2023-09-19 LAB — CMP14+EGFR
ALT: 18 [IU]/L (ref 0–32)
AST: 18 [IU]/L (ref 0–40)
Albumin: 4.1 g/dL (ref 3.8–4.9)
Alkaline Phosphatase: 118 [IU]/L (ref 44–121)
BUN/Creatinine Ratio: 30 — ABNORMAL HIGH (ref 9–23)
BUN: 24 mg/dL (ref 6–24)
Bilirubin Total: 0.6 mg/dL (ref 0.0–1.2)
CO2: 26 mmol/L (ref 20–29)
Calcium: 9.7 mg/dL (ref 8.7–10.2)
Chloride: 99 mmol/L (ref 96–106)
Creatinine, Ser: 0.81 mg/dL (ref 0.57–1.00)
Globulin, Total: 2.5 g/dL (ref 1.5–4.5)
Glucose: 114 mg/dL — ABNORMAL HIGH (ref 70–99)
Potassium: 3.3 mmol/L — ABNORMAL LOW (ref 3.5–5.2)
Sodium: 142 mmol/L (ref 134–144)
Total Protein: 6.6 g/dL (ref 6.0–8.5)
eGFR: 84 mL/min/{1.73_m2} (ref >59)

## 2023-09-19 LAB — BAYER DCA HB A1C WAIVED: HB A1C (BAYER DCA - WAIVED): 5.2 % (ref 4.8–5.6)

## 2023-09-19 LAB — TSH: TSH: 3.14 u[IU]/mL (ref 0.450–4.500)

## 2023-09-19 LAB — VITAMIN D 25 HYDROXY (VIT D DEFICIENCY, FRACTURES): Vit D, 25-Hydroxy: 35.7 ng/mL (ref 30.0–100.0)

## 2023-09-19 MED ORDER — POTASSIUM CHLORIDE CRYS ER 20 MEQ PO TBCR: 20.0000 meq | EXTENDED_RELEASE_TABLET | Freq: Every day | ORAL | 0 refills | Status: DC

## 2023-09-26 ENCOUNTER — Other Ambulatory Visit: Payer: 59

## 2023-09-30 ENCOUNTER — Other Ambulatory Visit: Payer: 59

## 2023-09-30 DIAGNOSIS — E876 Hypokalemia: Secondary | ICD-10-CM

## 2023-10-01 LAB — BMP8+EGFR
BUN/Creatinine Ratio: 26 — ABNORMAL HIGH (ref 9–23)
BUN: 22 mg/dL (ref 6–24)
CO2: 22 mmol/L (ref 20–29)
Calcium: 9.3 mg/dL (ref 8.7–10.2)
Chloride: 103 mmol/L (ref 96–106)
Creatinine, Ser: 0.84 mg/dL (ref 0.57–1.00)
Glucose: 89 mg/dL (ref 70–99)
Potassium: 3.5 mmol/L (ref 3.5–5.2)
Sodium: 144 mmol/L (ref 134–144)
eGFR: 80 mL/min/{1.73_m2} (ref >59)

## 2023-10-31 ENCOUNTER — Ambulatory Visit (INDEPENDENT_AMBULATORY_CARE_PROVIDER_SITE_OTHER): Payer: 59

## 2023-10-31 ENCOUNTER — Ambulatory Visit: Payer: 59 | Admitting: Family Medicine

## 2023-10-31 ENCOUNTER — Encounter: Payer: Self-pay | Admitting: Family Medicine

## 2023-10-31 VITALS — BP 143/88 | HR 79 | Temp 98.5°F | Ht 62.0 in | Wt 209.0 lb

## 2023-10-31 DIAGNOSIS — G8929 Other chronic pain: Secondary | ICD-10-CM

## 2023-10-31 DIAGNOSIS — I10 Essential (primary) hypertension: Secondary | ICD-10-CM | POA: Diagnosis not present

## 2023-10-31 DIAGNOSIS — F339 Major depressive disorder, recurrent, unspecified: Secondary | ICD-10-CM | POA: Diagnosis not present

## 2023-10-31 DIAGNOSIS — F419 Anxiety disorder, unspecified: Secondary | ICD-10-CM

## 2023-10-31 DIAGNOSIS — M25562 Pain in left knee: Secondary | ICD-10-CM

## 2023-10-31 NOTE — Progress Notes (Signed)
Acute Office Visit  Subjective:     Patient ID: Cindy Hall, female    DOB: 1966-04-02, 58 y.o.   MRN: [id number]  Chief Complaint  Patient presents with   Medical Management of Chronic Issues    Medication mgmt, 6 week follow up    HPI Patient is in today for follow up of anxiety and depression after starting zoloft 6 weeks ago. Reports feeling better. No side effects.   Left knee pain for 7-8 months after hyperextending her knee. Pain waxes and wanes. Feels stiff after sitting for awhile. Denies numbness or tingling. Walking helps. Worse with inactivity, twisitng. Has tried heat, ice, icy hot, elevated, tylenol, advil without improvement. Sometimes gives out.     BP is elevated today. She feels anxious when having her BP checked. Compliant with lisinopril, hydrochlorothiazide. Isn't able to check her BP at home. Denies symptoms.      09/18/2023    4:10 PM 03/25/2022   10:58 AM 07/04/2021    3:58 PM  Depression screen PHQ 2/9  Decreased Interest 0 1 0  Down, Depressed, Hopeless 1 1 0  PHQ - 2 Score 1 2 0  Altered sleeping 1 1 1   Tired, decreased energy 1 1 1   Change in appetite 0 0 0  Feeling bad or failure about yourself  1 1 0  Trouble concentrating 0 0 0  Moving slowly or fidgety/restless 0 0 0  Suicidal thoughts 0 0 0  PHQ-9 Score 4 5 2   Difficult doing work/chores Not difficult at all Not difficult at all Not difficult at all      09/18/2023    4:10 PM 03/25/2022   10:59 AM 07/04/2021    3:59 PM 03/30/2021    3:47 PM  GAD 7 : Generalized Anxiety Score  Nervous, Anxious, on Edge 1 0 0 0  Control/stop worrying 1 1 0 0  Worry too much - different things 1 1 1  0  Trouble relaxing 1 0 0 0  Restless 0 0 0 0  Easily annoyed or irritable 0 0 0 0  Afraid - awful might happen 0 0 0 0  Total GAD 7 Score 4 2 1  0  Anxiety Difficulty Not difficult at all Not difficult at all Not difficult at all      ROS As per HPI.      Objective:    BP (!) 143/88   Pulse 79    Temp 98.5 F (36.9 C)   Ht 5\' 2"  (1.575 m)   Wt 209 lb (94.8 kg)   SpO2 96%   BMI 38.23 kg/m  BP Readings from Last 3 Encounters:  10/31/23 (!) 143/88  09/18/23 129/84  03/25/22 138/82      Physical Exam Vitals and nursing note reviewed.  Constitutional:      General: She is not in acute distress.    Appearance: Normal appearance. She is not ill-appearing.  Cardiovascular:     Rate and Rhythm: Normal rate and regular rhythm.     Pulses: Normal pulses.     Heart sounds: Normal heart sounds. No murmur heard. Pulmonary:     Effort: Pulmonary effort is normal. No respiratory distress.     Breath sounds: Normal breath sounds.  Musculoskeletal:     Cervical back: Neck supple. No tenderness.     Left knee: No swelling, deformity or effusion. Tenderness present over the medial joint line. No patellar tendon tenderness.     Instability Tests: Medial McMurray test negative  and lateral McMurray test negative.     Right lower leg: No edema.     Left lower leg: No edema.  Lymphadenopathy:     Cervical: No cervical adenopathy.  Skin:    General: Skin is warm and dry.  Neurological:     General: No focal deficit present.     Mental Status: She is alert and oriented to person, place, and time.  Psychiatric:        Mood and Affect: Mood normal.        Behavior: Behavior normal.     No results found for any visits on 10/31/23.      Assessment & Plan:   Cindy Hall was seen today for medical management of chronic issues.  Diagnoses and all orders for this visit:  Primary hypertension BP not at goal today. At goal at recent visit.  Will have her return in 2 weeks for recheck. Continue lisinopril, hydrochlorothiazide.   Depression, recurrent (HCC) Anxiety Well controlled with zoloft.   Chronic pain of left knee Xray today in office. Radiology report pending. Will notify patient of results when available.  -     DG Knee 1-2 Views Left; Future   Return in about 2 weeks (around  11/14/2023) for nurse visit- BP check.  Gabriel Earing, FNP

## 2023-11-06 ENCOUNTER — Encounter: Payer: Self-pay | Admitting: Family Medicine

## 2023-11-07 NOTE — Telephone Encounter (Signed)
 Called pt and scheduled for injection with Tiffany 2/26 at 1:15.

## 2023-11-12 ENCOUNTER — Ambulatory Visit: Payer: 59 | Admitting: Family Medicine

## 2023-11-13 ENCOUNTER — Encounter: Payer: Self-pay | Admitting: Family Medicine

## 2023-11-13 ENCOUNTER — Ambulatory Visit: Payer: 59 | Admitting: Family Medicine

## 2023-11-13 VITALS — BP 128/82 | HR 72 | Temp 98.2°F | Ht 62.0 in | Wt 207.8 lb

## 2023-11-13 DIAGNOSIS — G8929 Other chronic pain: Secondary | ICD-10-CM

## 2023-11-13 DIAGNOSIS — M25562 Pain in left knee: Secondary | ICD-10-CM | POA: Diagnosis not present

## 2023-11-13 DIAGNOSIS — I10 Essential (primary) hypertension: Secondary | ICD-10-CM | POA: Diagnosis not present

## 2023-11-13 DIAGNOSIS — M1712 Unilateral primary osteoarthritis, left knee: Secondary | ICD-10-CM | POA: Diagnosis not present

## 2023-11-13 DIAGNOSIS — M1710 Unilateral primary osteoarthritis, unspecified knee: Secondary | ICD-10-CM

## 2023-11-13 MED ORDER — METHYLPREDNISOLONE ACETATE 80 MG/ML IJ SUSP
80.0000 mg | Freq: Once | INTRAMUSCULAR | Status: AC
  Administered 2023-11-13: 80 mg via INTRA_ARTICULAR

## 2023-11-13 NOTE — Progress Notes (Signed)
   Acute Office Visit  Subjective:     Patient ID: Cindy Hall, female    DOB: [birth year], 58 y.o.   MRN: [id number]  Chief Complaint  Patient presents with   Knee Pain    HPI Patient is in today for left knee injection. Cindy Hall has had pain in her left knee for 7-8 months. Xray showed arthritis. She has never had an joint injection before. She has tried heat, ice, icy hot, elevated, tylenol, advil without improvement.    BP was elevated at last visit. Well controlled today. No symptoms.   ROS As per HPI.      Objective:    BP 128/82   Pulse 72   Temp 98.2 F (36.8 C) (Temporal)   Ht 5\' 2"  (1.575 m)   Wt 207 lb 12.8 oz (94.3 kg)   SpO2 94%   BMI 38.01 kg/m    Physical Exam Vitals and nursing note reviewed.  Constitutional:      General: She is not in acute distress.    Appearance: She is not ill-appearing, toxic-appearing or diaphoretic.  Pulmonary:     Effort: Pulmonary effort is normal. No respiratory distress.  Musculoskeletal:     Left knee: No swelling, effusion or erythema. Normal alignment.     Right lower leg: No edema.     Left lower leg: No edema.  Skin:    General: Skin is warm and dry.  Neurological:     General: No focal deficit present.     Mental Status: She is alert and oriented to person, place, and time.  Psychiatric:        Mood and Affect: Mood normal.        Behavior: Behavior normal.    Joint Injection/Arthrocentesis  Date/Time: 11/13/2023 2:40 PM  Performed by: Gabriel Earing, FNP Authorized by: Gabriel Earing, FNP  Indications: pain  Body area: knee Joint: left knee Local anesthesia used: no  Anesthesia: Local anesthesia used: no  Sedation: Patient sedated: no  Needle size: 22 G Ultrasound guidance: no Approach: anteriolateral. Aspirate amount: 0 mL Methylprednisolone amount: 80 mg Lidocaine 1% amount: 2 mL Patient tolerance: patient tolerated the procedure well with no immediate complications     No  results found for any visits on 11/13/23.      Assessment & Plan:   Cindy Hall was seen today for knee pain.  Diagnoses and all orders for this visit:  Chronic pain of left knee Arthritis of knee Left knee injection today. Tolerated well. Discussed referral to ortho if symptoms worse or persist.  -     methylPREDNISolone acetate (DEPO-MEDROL) injection 80 mg -     Joint Injection/Arthrocentesis  Primary hypertension Well controlled on current regimen.   Return in about 3 months (around 02/10/2024) for chronic follow up.  The patient indicates understanding of these issues and agrees with the plan.  Gabriel Earing, FNP

## 2023-11-14 ENCOUNTER — Ambulatory Visit: Payer: 59

## 2023-11-15 ENCOUNTER — Telehealth: Payer: Self-pay | Admitting: Family Medicine

## 2023-11-15 DIAGNOSIS — R051 Acute cough: Secondary | ICD-10-CM | POA: Diagnosis not present

## 2023-11-15 MED ORDER — PROMETHAZINE-DM 6.25-15 MG/5ML PO SYRP: 5.0000 mL | ORAL_SOLUTION | Freq: Four times a day (QID) | ORAL | 0 refills | Status: AC | PRN

## 2023-11-15 MED ORDER — BENZONATATE 200 MG PO CAPS: 200.0000 mg | ORAL_CAPSULE | Freq: Two times a day (BID) | ORAL | 0 refills | Status: DC | PRN

## 2023-11-15 NOTE — Progress Notes (Signed)
E-Visit for Cough  We are sorry that you are not feeling well.  Here is how we plan to help!  Based on your presentation I believe you most likely have A cough due to a virus.  This is called viral bronchitis and is best treated by rest, plenty of fluids and control of the cough.  You may use Ibuprofen or Tylenol as directed to help your symptoms.     In addition you may use A prescription cough medication called Tessalon Perles 100mg. You may take 1-2 capsules every 8 hours as needed for your cough.    From your responses in the eVisit questionnaire you describe inflammation in the upper respiratory tract which is causing a significant cough.  This is commonly called Bronchitis and has four common causes:   Allergies Viral Infections Acid Reflux Bacterial Infection Allergies, viruses and acid reflux are treated by controlling symptoms or eliminating the cause. An example might be a cough caused by taking certain blood pressure medications. You stop the cough by changing the medication. Another example might be a cough caused by acid reflux. Controlling the reflux helps control the cough.  USE OF BRONCHODILATOR ("RESCUE") INHALERS: There is a risk from using your bronchodilator too frequently.  The risk is that over-reliance on a medication which only relaxes the muscles surrounding the breathing tubes can reduce the effectiveness of medications prescribed to reduce swelling and congestion of the tubes themselves.  Although you feel brief relief from the bronchodilator inhaler, your asthma may actually be worsening with the tubes becoming more swollen and filled with mucus.  This can delay other crucial treatments, such as oral steroid medications. If you need to use a bronchodilator inhaler daily, several times per day, you should discuss this with your provider.  There are probably better treatments that could be used to keep your asthma under control.     HOME CARE Only take medications as  instructed by your medical team. Complete the entire course of an antibiotic. Drink plenty of fluids and get plenty of rest. Avoid close contacts especially the very young and the elderly Cover your mouth if you cough or cough into your sleeve. Always remember to wash your hands A steam or ultrasonic humidifier can help congestion.   GET HELP RIGHT AWAY IF: You develop worsening fever. You become short of breath You cough up blood. Your symptoms persist after you have completed your treatment plan MAKE SURE YOU  Understand these instructions. Will watch your condition. Will get help right away if you are not doing well or get worse.    Thank you for choosing an e-visit.  Your e-visit answers were reviewed by a board certified advanced clinical practitioner to complete your personal care plan. Depending upon the condition, your plan could have included both over the counter or prescription medications.  Please review your pharmacy choice. Make sure the pharmacy is open so you can pick up prescription now. If there is a problem, you may contact your provider through MyChart messaging and have the prescription routed to another pharmacy.  Your safety is important to us. If you have drug allergies check your prescription carefully.   For the next 24 hours you can use MyChart to ask questions about today's visit, request a non-urgent call back, or ask for a work or school excuse. You will get an email in the next two days asking about your experience. I hope that your e-visit has been valuable and will speed your recovery.      have provided 5 minutes of non face to face time during this encounter for chart review and documentation.   

## 2023-11-15 NOTE — Addendum Note (Signed)
 Addended by: Georgana Curio on: 11/15/2023 08:21 AM   Modules accepted: Orders

## 2023-11-17 ENCOUNTER — Telehealth: Payer: Self-pay | Admitting: Physician Assistant

## 2023-11-17 DIAGNOSIS — J208 Acute bronchitis due to other specified organisms: Secondary | ICD-10-CM

## 2023-11-17 MED ORDER — ALBUTEROL SULFATE HFA 108 (90 BASE) MCG/ACT IN AERS: 1.0000 | INHALATION_SPRAY | Freq: Four times a day (QID) | RESPIRATORY_TRACT | 0 refills | Status: AC | PRN

## 2023-11-17 MED ORDER — PREDNISONE 10 MG (21) PO TBPK: ORAL_TABLET | ORAL | 0 refills | Status: DC

## 2023-11-17 NOTE — Progress Notes (Signed)
 E-Visit for Cough   We are sorry that you are not feeling well.  Here is how we plan to help!  Based on your presentation I believe you most likely have A cough due to a virus.  This is called viral bronchitis and is best treated by rest, plenty of fluids and control of the cough.  You may use Ibuprofen or Tylenol as directed to help your symptoms.     In addition you may use A non-prescription cough medication called Mucinex DM: take 2 tablets every 12 hours.  I have prescribed: Prednisone 10 mg daily for 6 days (see taper instructions below)  Directions for 6 day taper: Day 1: 2 tablets before breakfast, 1 after both lunch & dinner and 2 at bedtime Day 2: 1 tab before breakfast, 1 after both lunch & dinner and 2 at bedtime Day 3: 1 tab at each meal & 1 at bedtime Day 4: 1 tab at breakfast, 1 at lunch, 1 at bedtime Day 5: 1 tab at breakfast & 1 tab at bedtime Day 6: 1 tab at breakfast  AND  Albuterol inhaler Use 1-2 puffs every 6 hours as needed for shortness of breath, chest tightness, and/or wheezing.  From your responses in the eVisit questionnaire you describe inflammation in the upper respiratory tract which is causing a significant cough.  This is commonly called Bronchitis and has four common causes:   Allergies Viral Infections Acid Reflux Bacterial Infection Allergies, viruses and acid reflux are treated by controlling symptoms or eliminating the cause. An example might be a cough caused by taking certain blood pressure medications. You stop the cough by changing the medication. Another example might be a cough caused by acid reflux. Controlling the reflux helps control the cough.  USE OF BRONCHODILATOR ("RESCUE") INHALERS: There is a risk from using your bronchodilator too frequently.  The risk is that over-reliance on a medication which only relaxes the muscles surrounding the breathing tubes can reduce the effectiveness of medications prescribed to reduce swelling and  congestion of the tubes themselves.  Although you feel brief relief from the bronchodilator inhaler, your asthma may actually be worsening with the tubes becoming more swollen and filled with mucus.  This can delay other crucial treatments, such as oral steroid medications. If you need to use a bronchodilator inhaler daily, several times per day, you should discuss this with your provider.  There are probably better treatments that could be used to keep your asthma under control.     HOME CARE Only take medications as instructed by your medical team. Complete the entire course of an antibiotic. Drink plenty of fluids and get plenty of rest. Avoid close contacts especially the very young and the elderly Cover your mouth if you cough or cough into your sleeve. Always remember to wash your hands A steam or ultrasonic humidifier can help congestion.   GET HELP RIGHT AWAY IF: You develop worsening fever. You become short of breath You cough up blood. Your symptoms persist after you have completed your treatment plan MAKE SURE YOU  Understand these instructions. Will watch your condition. Will get help right away if you are not doing well or get worse.    Thank you for choosing an e-visit.  Your e-visit answers were reviewed by a board certified advanced clinical practitioner to complete your personal care plan. Depending upon the condition, your plan could have included both over the counter or prescription medications.  Please review your pharmacy choice. Make sure  the pharmacy is open so you can pick up prescription now. If there is a problem, you may contact your provider through [employer] and have the prescription routed to another pharmacy.  Your safety is important to [place]. If you have drug allergies check your prescription carefully.   For the next 24 hours you can use MyChart to ask questions about today's visit, request a non-urgent call back, or ask for a work or school  excuse. You will get an email in the next two days asking about your experience. I hope that your e-visit has been valuable and will speed your recovery.    I have spent 5 minutes in review of e-visit questionnaire, review and updating patient chart, medical decision making and response to patient.   Margaretann Loveless, PA-C

## 2023-11-25 ENCOUNTER — Telehealth: Admitting: Physician Assistant

## 2023-11-25 DIAGNOSIS — J208 Acute bronchitis due to other specified organisms: Secondary | ICD-10-CM

## 2023-11-25 DIAGNOSIS — B9689 Other specified bacterial agents as the cause of diseases classified elsewhere: Secondary | ICD-10-CM

## 2023-11-25 MED ORDER — AZITHROMYCIN 250 MG PO TABS: ORAL_TABLET | ORAL | 0 refills | Status: AC

## 2023-11-25 NOTE — Progress Notes (Signed)
 E-Visit for Cough   We are sorry that you are not feeling well.  Here is how we plan to help!  Based on your presentation I believe you most likely have A cough due to bacteria.  When patients have a fever and a productive cough with a change in color or increased sputum production, we are concerned about bacterial bronchitis.  If left untreated it can progress to pneumonia.  If your symptoms do not improve with your treatment plan it is important that you contact your provider.   I have prescribed Azithromyin 250 mg: two tablets now and then one tablet daily for 4 additonal days    In addition you may use A non-prescription cough medication called Mucinex DM: take 2 tablets every 12 hours.  From your responses in the eVisit questionnaire you describe inflammation in the upper respiratory tract which is causing a significant cough.  This is commonly called Bronchitis and has four common causes:   Allergies Viral Infections Acid Reflux Bacterial Infection Allergies, viruses and acid reflux are treated by controlling symptoms or eliminating the cause. An example might be a cough caused by taking certain blood pressure medications. You stop the cough by changing the medication. Another example might be a cough caused by acid reflux. Controlling the reflux helps control the cough.    HOME CARE Only take medications as instructed by your medical team. Complete the entire course of an antibiotic. Drink plenty of fluids and get plenty of rest. Avoid close contacts especially the very young and the elderly Cover your mouth if you cough or cough into your sleeve. Always remember to wash your hands A steam or ultrasonic humidifier can help congestion.   GET HELP RIGHT AWAY IF: You develop worsening fever. You become short of breath You cough up blood. Your symptoms persist after you have completed your treatment plan MAKE SURE YOU  Understand these instructions. Will watch your condition. Will  get help right away if you are not doing well or get worse.    Thank you for choosing an e-visit.  Your e-visit answers were reviewed by a board certified advanced clinical practitioner to complete your personal care plan. Depending upon the condition, your plan could have included both over the counter or prescription medications.  Please review your pharmacy choice. Make sure the pharmacy is open so you can pick up prescription now. If there is a problem, you may contact your provider through [employer] and have the prescription routed to another pharmacy.  Your safety is important to [place]. If you have drug allergies check your prescription carefully.   For the next 24 hours you can use MyChart to ask questions about today's visit, request a non-urgent call back, or ask for a work or school excuse. You will get an email in the next two days asking about your experience. I hope that your e-visit has been valuable and will speed your recovery.   I have spent 5 minutes in review of e-visit questionnaire, review and updating patient chart, medical decision making and response to patient.   Margaretann Loveless, PA-C

## 2023-11-30 ENCOUNTER — Telehealth

## 2023-11-30 DIAGNOSIS — R3989 Other symptoms and signs involving the genitourinary system: Secondary | ICD-10-CM

## 2023-12-01 MED ORDER — CEPHALEXIN 500 MG PO CAPS: 500.0000 mg | ORAL_CAPSULE | Freq: Two times a day (BID) | ORAL | 0 refills | Status: AC

## 2023-12-01 NOTE — Progress Notes (Signed)

## 2023-12-01 NOTE — Progress Notes (Signed)
 I have spent 5 minutes in review of e-visit questionnaire, review and updating patient chart, medical decision making and response to patient.   Piedad Climes, PA-C

## 2023-12-06 ENCOUNTER — Other Ambulatory Visit: Payer: Self-pay | Admitting: Family Medicine

## 2023-12-15 ENCOUNTER — Other Ambulatory Visit: Payer: Self-pay | Admitting: Family Medicine

## 2023-12-15 DIAGNOSIS — E876 Hypokalemia: Secondary | ICD-10-CM

## 2023-12-18 ENCOUNTER — Other Ambulatory Visit: Payer: Self-pay | Admitting: Family Medicine

## 2023-12-18 DIAGNOSIS — F339 Major depressive disorder, recurrent, unspecified: Secondary | ICD-10-CM

## 2023-12-18 DIAGNOSIS — F419 Anxiety disorder, unspecified: Secondary | ICD-10-CM

## 2024-01-21 ENCOUNTER — Encounter: Payer: Self-pay | Admitting: Family Medicine

## 2024-01-27 ENCOUNTER — Other Ambulatory Visit: Payer: Self-pay | Admitting: Family Medicine

## 2024-01-27 DIAGNOSIS — I10 Essential (primary) hypertension: Secondary | ICD-10-CM

## 2024-02-11 ENCOUNTER — Ambulatory Visit: Payer: 59 | Admitting: Family Medicine

## 2024-02-11 ENCOUNTER — Encounter: Payer: Self-pay | Admitting: Family Medicine

## 2024-02-11 VITALS — BP 135/83 | HR 68 | Temp 97.9°F | Ht 62.0 in | Wt 206.8 lb

## 2024-02-11 DIAGNOSIS — E782 Mixed hyperlipidemia: Secondary | ICD-10-CM

## 2024-02-11 DIAGNOSIS — F419 Anxiety disorder, unspecified: Secondary | ICD-10-CM

## 2024-02-11 DIAGNOSIS — F339 Major depressive disorder, recurrent, unspecified: Secondary | ICD-10-CM

## 2024-02-11 DIAGNOSIS — I10 Essential (primary) hypertension: Secondary | ICD-10-CM

## 2024-02-11 DIAGNOSIS — M25562 Pain in left knee: Secondary | ICD-10-CM

## 2024-02-11 DIAGNOSIS — G8929 Other chronic pain: Secondary | ICD-10-CM

## 2024-02-11 NOTE — Progress Notes (Signed)
 Established Patient Office Visit  Subjective   Patient ID: Cindy Hall, female    DOB: [birth year]  Age: 58 y.o. MRN: [id number]  Chief Complaint  Patient presents with   Medical Management of Chronic Issues    HPI  HTN Complaint with meds - Yes Current Medications - lisinopril , HCTZ Pertinent ROS:  Headache - No Fatigue - No Visual Disturbances - No Chest pain - No Dyspnea - No Palpitations - No LE edema - No  2. Anxiety/depression Doing well with zoloft .    3. Chronic knee pain Significantly improved after knee injection.      02/11/2024    3:14 PM 11/13/2023    2:30 PM 10/31/2023    3:15 PM  Depression screen PHQ 2/9  Decreased Interest 0 0 0  Down, Depressed, Hopeless 0 0 0  PHQ - 2 Score 0 0 0  Altered sleeping 1 0 1  Tired, decreased energy 1 1 1   Change in appetite 0 0 0  Feeling bad or failure about yourself  0 0 0  Trouble concentrating 0 0 0  Moving slowly or fidgety/restless 0 0 0  Suicidal thoughts 0 0   PHQ-9 Score 2 1 2   Difficult doing work/chores Not difficult at all Not difficult at all Not difficult at all      02/11/2024    3:16 PM 11/13/2023    2:31 PM 10/31/2023    3:20 PM 09/18/2023    4:10 PM  GAD 7 : Generalized Anxiety Score  Nervous, Anxious, on Edge 0 1 0 1  Control/stop worrying 0 0 0 1  Worry too much - different things 0 1 0 1  Trouble relaxing 0 0 0 1  Restless 0 0 0 0  Easily annoyed or irritable 0 0 0 0  Afraid - awful might happen 0 0 0 0  Total GAD 7 Score 0 2 0 4  Anxiety Difficulty Not difficult at all Not difficult at all Not difficult at all Not difficult at all     Past Medical History:  Diagnosis Date   Endometriosis determined by laparoscopy    Family history of adverse reaction to anesthesia    " my father had to be put on a ventilator after surgery "   Gallstones 04/30/2018      ROS As per HPI.   Objective:     BP 135/83   Pulse 68   Temp 97.9 F (36.6 C) (Temporal)   Ht 5\' 2"  (1.575 m)    Wt 206 lb 12.8 oz (93.8 kg)   SpO2 96%   BMI 37.82 kg/m  Wt Readings from Last 3 Encounters:  02/11/24 206 lb 12.8 oz (93.8 kg)  11/13/23 207 lb 12.8 oz (94.3 kg)  10/31/23 209 lb (94.8 kg)      Physical Exam Vitals and nursing note reviewed.  Constitutional:      General: Cindy Hall is not in acute distress.    Appearance: Normal appearance. Cindy Hall is not ill-appearing.  Cardiovascular:     Rate and Rhythm: Normal rate and regular rhythm.     Pulses: Normal pulses.     Heart sounds: Normal heart sounds. No murmur heard. Pulmonary:     Effort: Pulmonary effort is normal. No respiratory distress.     Breath sounds: Normal breath sounds.  Musculoskeletal:     Right lower leg: No edema.     Left lower leg: No edema.  Skin:    General: Skin is warm and dry.  Neurological:     General: No focal deficit present.     Mental Status: Cindy Hall is alert and oriented to person, place, and time.  Psychiatric:        Mood and Affect: Mood normal.        Behavior: Behavior normal.      No results found for any visits on 02/11/24.    The 10-year ASCVD risk score (Arnett DK, et al., 2019) is: 6.8%    Assessment & Plan:   Cindy Hall was seen today for medical management of chronic issues.  Diagnoses and all orders for this visit:  Primary hypertension Well controlled on current regimen.   Mixed hyperlipidemia On statin.   Morbid obesity (HCC) Trending down. Diet, exercise, weight loss.   Depression, recurrent (HCC) Anxiety Well controlled on current regimen.   Chronic knee pain Improved after joint injection.  Return in about 6 months (around 08/13/2024) for chronic follow up.   The patient indicates understanding of these issues and agrees with the plan.  Cindy Huger, FNP

## 2024-03-14 ENCOUNTER — Other Ambulatory Visit: Payer: Self-pay | Admitting: Family Medicine

## 2024-03-14 DIAGNOSIS — E782 Mixed hyperlipidemia: Secondary | ICD-10-CM

## 2024-03-14 DIAGNOSIS — E876 Hypokalemia: Secondary | ICD-10-CM

## 2024-03-29 ENCOUNTER — Other Ambulatory Visit: Payer: Self-pay | Admitting: Family Medicine

## 2024-03-29 ENCOUNTER — Ambulatory Visit
Admission: RE | Admit: 2024-03-29 | Discharge: 2024-03-29 | Disposition: A | Discharge status: Home / Self Care | Source: Ambulatory Visit | Attending: Family Medicine | Admitting: Family Medicine

## 2024-03-29 DIAGNOSIS — Z1231 Encounter for screening mammogram for malignant neoplasm of breast: Secondary | ICD-10-CM

## 2024-05-02 ENCOUNTER — Other Ambulatory Visit: Payer: Self-pay | Admitting: Family Medicine

## 2024-05-02 DIAGNOSIS — I10 Essential (primary) hypertension: Secondary | ICD-10-CM

## 2024-05-27 ENCOUNTER — Telehealth: Admitting: Nurse Practitioner

## 2024-05-27 DIAGNOSIS — J208 Acute bronchitis due to other specified organisms: Secondary | ICD-10-CM | POA: Diagnosis not present

## 2024-05-27 MED ORDER — PREDNISONE 10 MG PO TABS: ORAL_TABLET | ORAL | 0 refills | Status: DC

## 2024-05-27 NOTE — Progress Notes (Signed)
 I have spent 5 minutes in review of e-visit questionnaire, review and updating patient chart, medical decision making and response to patient.   Claiborne Rigg, NP

## 2024-05-27 NOTE — Progress Notes (Signed)
 E-Visit for Cough   We are sorry that you are not feeling well.  Here is how we plan to help!  Based on your presentation I believe you most likely have A cough due to a virus.  This is called viral bronchitis and is best treated by rest, plenty of fluids and control of the cough.  You may use Ibuprofen or Tylenol  as directed to help your symptoms.     In addition you may take  Prednisone  10 mg daily for 6 days (see taper instructions below)  Directions for 6 day taper: Day 1: 2 tablets before breakfast, 1 after both lunch & dinner and 2 at bedtime Day 2: 1 tab before breakfast, 1 after both lunch & dinner and 2 at bedtime Day 3: 1 tab at each meal & 1 at bedtime Day 4: 1 tab at breakfast, 1 at lunch, 1 at bedtime Day 5: 1 tab at breakfast & 1 tab at bedtime Day 6: 1 tab at breakfast  From your responses in the eVisit questionnaire you describe inflammation in the upper respiratory tract which is causing a significant cough.  This is commonly called Bronchitis and has four common causes:   Allergies Viral Infections Acid Reflux Bacterial Infection Allergies, viruses and acid reflux are treated by controlling symptoms or eliminating the cause. An example might be a cough caused by taking certain blood pressure medications. You stop the cough by changing the medication. Another example might be a cough caused by acid reflux. Controlling the reflux helps control the cough.  USE OF BRONCHODILATOR (RESCUE) INHALERS: There is a risk from using your bronchodilator too frequently.  The risk is that over-reliance on a medication which only relaxes the muscles surrounding the breathing tubes can reduce the effectiveness of medications prescribed to reduce swelling and congestion of the tubes themselves.  Although you feel brief relief from the bronchodilator inhaler, your asthma may actually be worsening with the tubes becoming more swollen and filled with mucus.  This can delay other crucial  treatments, such as oral steroid medications. If you need to use a bronchodilator inhaler daily, several times per day, you should discuss this with your provider.  There are probably better treatments that could be used to keep your asthma under control.     HOME CARE Only take medications as instructed by your medical team. Complete the entire course of an antibiotic. Drink plenty of fluids and get plenty of rest. Avoid close contacts especially the very young and the elderly Cover your mouth if you cough or cough into your sleeve. Always remember to wash your hands A steam or ultrasonic humidifier can help congestion.   GET HELP RIGHT AWAY IF: You develop worsening fever. You become short of breath You cough up blood. Your symptoms persist after you have completed your treatment plan MAKE SURE YOU  Understand these instructions. Will watch your condition. Will get help right away if you are not doing well or get worse.    Thank you for choosing an e-visit.  Your e-visit answers were reviewed by a board certified advanced clinical practitioner to complete your personal care plan. Depending upon the condition, your plan could have included both over the counter or prescription medications.  Please review your pharmacy choice. Make sure the pharmacy is open so you can pick up prescription now. If there is a problem, you may contact your provider through [employer] and have the prescription routed to another pharmacy.  Your safety is important  to us . If you have drug allergies check your prescription carefully.   For the next 24 hours you can use MyChart to ask questions about today's visit, request a non-urgent call back, or ask for a work or school excuse. You will get an email in the next two days asking about your experience. I hope that your e-visit has been valuable and will speed your recovery.

## 2024-06-15 ENCOUNTER — Other Ambulatory Visit: Payer: Self-pay | Admitting: Family Medicine

## 2024-06-15 DIAGNOSIS — F419 Anxiety disorder, unspecified: Secondary | ICD-10-CM

## 2024-06-15 DIAGNOSIS — F339 Major depressive disorder, recurrent, unspecified: Secondary | ICD-10-CM

## 2024-08-04 ENCOUNTER — Other Ambulatory Visit: Payer: Self-pay | Admitting: Family Medicine

## 2024-08-04 DIAGNOSIS — E782 Mixed hyperlipidemia: Secondary | ICD-10-CM

## 2024-08-23 ENCOUNTER — Ambulatory Visit: Payer: Self-pay | Admitting: Family Medicine

## 2024-09-03 ENCOUNTER — Telehealth: Admitting: Family Medicine

## 2024-09-03 DIAGNOSIS — R197 Diarrhea, unspecified: Secondary | ICD-10-CM

## 2024-09-03 MED ORDER — ONDANSETRON HCL 4 MG PO TABS: 4.0000 mg | ORAL_TABLET | Freq: Three times a day (TID) | ORAL | 0 refills | Status: AC | PRN

## 2024-09-03 NOTE — Progress Notes (Signed)
 We are sorry that you are not feeling well.  Here is how we plan to help!  Based on what you have shared with me it looks like you have Acute Infectious Diarrhea.  Most cases of acute diarrhea are due to infections with virus and bacteria and are self-limited conditions lasting less than 14 days.  For your symptoms you may take Imodium  2 mg tablets that are over the counter at your local pharmacy. Take two tablet now and then one after each loose stool up to 6 a day.  Antibiotics are not needed for most people with diarrhea.  Optional: Zofran  4 mg 1 tablet every 8 hours as needed for nausea and vomiting   HOME CARE We recommend changing your diet to help with your symptoms for the next few days. Drink plenty of fluids that contain water salt and sugar. Sports drinks such as Gatorade may help.  You may try broths, soups, bananas, applesauce, soft breads, mashed potatoes or crackers.  You are considered infectious for as long as the diarrhea continues. Hand washing or use of alcohol based hand sanitizers is recommend. It is best to stay out of work or school until your symptoms stop.   GET HELP RIGHT AWAY If you have dark yellow colored urine or do not pass urine frequently you should drink more fluids.   If your symptoms worsen  If you feel like you are going to pass out (faint) You have a new problem  MAKE SURE YOU  Understand these instructions. Will watch your condition. Will get help right away if you are not doing well or get worse.  Your e-visit answers were reviewed by a board certified advanced clinical practitioner to complete your personal care plan.  Depending on the condition, your plan could have included both over the counter or prescription medications.  If there is a problem please reply  once you have received a response from your provider.  Your safety is important to us .  If you have drug allergies check your prescription carefully.    You can use MyChart to ask  questions about today's visit, request a non-urgent call back, or ask for a work or school excuse for 24 hours related to this e-Visit. If it has been greater than 24 hours you will need to follow up with your provider, or enter a new e-Visit to address those concerns.   You will get an e-mail in the next two days asking about your experience.  I hope that your e-visit has been valuable and will speed your recovery. Thank you for using e-visits.   I have spent 5 minutes in review of e-visit questionnaire, review and updating patient chart, medical decision making and response to patient.   Ileta Ofarrell, FNP

## 2024-09-06 ENCOUNTER — Encounter

## 2024-09-18 ENCOUNTER — Other Ambulatory Visit: Payer: Self-pay | Admitting: Family Medicine

## 2024-09-18 DIAGNOSIS — F419 Anxiety disorder, unspecified: Secondary | ICD-10-CM

## 2024-09-18 DIAGNOSIS — F339 Major depressive disorder, recurrent, unspecified: Secondary | ICD-10-CM

## 2024-09-27 ENCOUNTER — Telehealth: Admitting: Physician Assistant

## 2024-09-27 DIAGNOSIS — J029 Acute pharyngitis, unspecified: Secondary | ICD-10-CM | POA: Diagnosis not present

## 2024-09-27 NOTE — Progress Notes (Signed)
 We are sorry that you are not feeling well.  Here is how we plan to help!  Your symptoms indicate a likely viral infection (Pharyngitis).   Pharyngitis is inflammation in the back of the throat which can cause a sore throat, scratchiness and sometimes difficulty swallowing.   Pharyngitis is typically caused by a respiratory virus and will just run its course.  Please keep in mind that your symptoms could last up to 10 days.    For throat pain, we recommend over the counter oral pain relief medications such as acetaminophen  or aspirin, or anti-inflammatory medications such as ibuprofen  or naproxen sodium.  Topical treatments such as oral throat lozenges or sprays may be used as needed.   Avoid close contact with loved ones, especially the very young and elderly.  Remember to wash your hands thoroughly throughout the day as this is the number one way to prevent the spread of infection! We also recommend that you periodically wipe down door knobs and counters with disinfectant.  After careful review of your answers, I would not recommend and antibiotic for your condition.  Antibiotics should not be used to treat conditions that we suspect are caused by viruses like the virus that causes the common cold or flu.  Providers prescribe antibiotics to treat infections caused by bacteria. Antibiotics are very powerful in treating bacterial infections when they are used properly.  To maintain their effectiveness, they should be used only when necessary.  Overuse of antibiotics has resulted in the development of super bugs that are resistant to treatment!    Some people with strep throat, however, can have atypical symptoms. As such, if anything continued to progress despite treatment recommendations, you may need formal testing in clinic or office.  Home Care: Only take medications as instructed by your medical team. Do not drink alcohol while taking these medications. A steam or ultrasonic humidifier can help  congestion.  You can place a towel over your head and breathe in the steam from hot water coming from a faucet. Avoid close contacts especially the very young and the elderly. Cover your mouth when you cough or sneeze. Always remember to wash your hands.  Get Help Right Away If: You develop worsening fever or throat pain. You develop a severe head ache or visual changes. Your symptoms persist after you have completed your treatment plan.  Make sure you Understand these instructions. Will watch your condition. Will get help right away if you are not doing well or get worse.  Your e-visit answers were reviewed by a board certified advanced clinical practitioner to complete your personal care plan.  Depending on the condition, your plan could have included both over the counter or prescription medications.  If there is a problem please reply once you have received a response from your provider.  Your safety is important to us .  If you have drug allergies check your prescription carefully.    You can use MyChart to ask questions about todays visit, request a non-urgent call back, or ask for a work or school excuse for 24 hours related to this e-Visit. If it has been greater than 24 hours you will need to follow up with your provider, or enter a new e-Visit to address those concerns.  You will get an e-mail in the next two days asking about your experience.  I hope that your e-visit has been valuable and will speed your recovery. Thank you for using e-visits.   I have spent 5 minutes  in review of e-visit questionnaire, review and updating patient chart, medical decision making and response to patient.   Elsie Velma Lunger, PA-C

## 2024-11-04 ENCOUNTER — Encounter: Admitting: Family Medicine
